# Patient Record
Sex: Male | Born: 1945
Health system: Southern US, Community
[De-identification: ages and names within clinical notes are randomized; demographics above are authoritative.]

## PROBLEM LIST (undated history)

## (undated) DIAGNOSIS — N400 Enlarged prostate without lower urinary tract symptoms: Secondary | ICD-10-CM

## (undated) DIAGNOSIS — T7840XA Allergy, unspecified, initial encounter: Secondary | ICD-10-CM

## (undated) DIAGNOSIS — N529 Male erectile dysfunction, unspecified: Secondary | ICD-10-CM

## (undated) DIAGNOSIS — E785 Hyperlipidemia, unspecified: Secondary | ICD-10-CM

## (undated) DIAGNOSIS — I10 Essential (primary) hypertension: Secondary | ICD-10-CM

## (undated) HISTORY — PX: TONSILLECTOMY: SUR1361

## (undated) HISTORY — DX: Essential (primary) hypertension: I10

## (undated) HISTORY — DX: Benign prostatic hyperplasia without lower urinary tract symptoms: N40.0

## (undated) HISTORY — DX: Male erectile dysfunction, unspecified: N52.9

## (undated) HISTORY — DX: Allergy, unspecified, initial encounter: T78.40XA

## (undated) HISTORY — DX: Hyperlipidemia, unspecified: E78.5

---

## 1998-06-07 ENCOUNTER — Ambulatory Visit (HOSPITAL_COMMUNITY): Admission: RE | Admit: 1998-06-07 | Discharge: 1998-06-07 | Payer: Self-pay | Admitting: Family Medicine

## 2000-01-29 ENCOUNTER — Emergency Department (HOSPITAL_COMMUNITY): Admission: EM | Admit: 2000-01-29 | Discharge: 2000-01-29 | Payer: Self-pay | Admitting: Emergency Medicine

## 2000-01-29 ENCOUNTER — Encounter: Payer: Self-pay | Admitting: Emergency Medicine

## 2004-12-11 ENCOUNTER — Ambulatory Visit: Payer: Self-pay | Admitting: Family Medicine

## 2005-01-08 ENCOUNTER — Ambulatory Visit: Payer: Self-pay | Admitting: Family Medicine

## 2005-04-10 ENCOUNTER — Ambulatory Visit: Payer: Self-pay | Admitting: Family Medicine

## 2005-07-10 ENCOUNTER — Ambulatory Visit: Payer: Self-pay | Admitting: Family Medicine

## 2005-11-04 ENCOUNTER — Ambulatory Visit: Payer: Self-pay | Admitting: Family Medicine

## 2005-11-12 ENCOUNTER — Ambulatory Visit: Payer: Self-pay | Admitting: Family Medicine

## 2005-11-16 ENCOUNTER — Ambulatory Visit: Payer: Self-pay | Admitting: Cardiology

## 2005-11-17 ENCOUNTER — Ambulatory Visit: Payer: Self-pay | Admitting: Family Medicine

## 2007-02-04 ENCOUNTER — Ambulatory Visit: Payer: Self-pay | Admitting: Family Medicine

## 2007-02-04 LAB — CONVERTED CEMR LAB
ALT: 37 units/L (ref 0–40)
AST: 26 units/L (ref 0–37)
Albumin: 3.8 g/dL (ref 3.5–5.2)
Alkaline Phosphatase: 38 units/L — ABNORMAL LOW (ref 39–117)
BUN: 14 mg/dL (ref 6–23)
Basophils Absolute: 0 10*3/uL (ref 0.0–0.1)
Basophils Relative: 0.7 % (ref 0.0–1.0)
Bilirubin, Direct: 0.1 mg/dL (ref 0.0–0.3)
CO2: 31 meq/L (ref 19–32)
Calcium: 9.3 mg/dL (ref 8.4–10.5)
Chloride: 106 meq/L (ref 96–112)
Cholesterol: 165 mg/dL (ref 0–200)
Creatinine, Ser: 1.1 mg/dL (ref 0.4–1.5)
Eosinophils Absolute: 0.2 10*3/uL (ref 0.0–0.6)
Eosinophils Relative: 2.4 % (ref 0.0–5.0)
GFR calc Af Amer: 88 mL/min
GFR calc non Af Amer: 73 mL/min
Glucose, Bld: 97 mg/dL (ref 70–99)
HCT: 40.8 % (ref 39.0–52.0)
HDL: 36.5 mg/dL — ABNORMAL LOW (ref >39.0)
Hemoglobin: 14.2 g/dL (ref 13.0–17.0)
LDL Cholesterol: 112 mg/dL — ABNORMAL HIGH (ref 0–99)
Lymphocytes Relative: 23.1 % (ref 12.0–46.0)
MCHC: 34.9 g/dL (ref 30.0–36.0)
MCV: 92.1 fL (ref 78.0–100.0)
Monocytes Absolute: 0.6 10*3/uL (ref 0.2–0.7)
Monocytes Relative: 9.8 % (ref 3.0–11.0)
Neutro Abs: 4.2 10*3/uL (ref 1.4–7.7)
Neutrophils Relative %: 64 % (ref 43.0–77.0)
PSA: 0.5 ng/mL (ref 0.10–4.00)
Platelets: 210 10*3/uL (ref 150–400)
Potassium: 3.9 meq/L (ref 3.5–5.1)
RBC: 4.42 M/uL (ref 4.22–5.81)
RDW: 13.4 % (ref 11.5–14.6)
Sodium: 142 meq/L (ref 135–145)
TSH: 0.59 microintl units/mL (ref 0.35–5.50)
Total Bilirubin: 0.7 mg/dL (ref 0.3–1.2)
Total CHOL/HDL Ratio: 4.5
Total Protein: 7.1 g/dL (ref 6.0–8.3)
Triglycerides: 83 mg/dL (ref 0–149)
VLDL: 17 mg/dL (ref 0–40)
WBC: 6.5 10*3/uL (ref 4.5–10.5)

## 2007-02-11 ENCOUNTER — Ambulatory Visit: Payer: Self-pay | Admitting: Family Medicine

## 2007-04-05 ENCOUNTER — Ambulatory Visit: Payer: Self-pay | Admitting: Internal Medicine

## 2007-08-06 IMAGING — CT CT ABDOMEN W/O CM
2 of 5 series · 16 of 46 positions shown, 18 images · non-contrast
Comparison: None.

CLINICAL DATA: Dizziness.

HEAD CT WITHOUT CONTRAST
TECHNIQUE: 5mm collimated images were obtained from the base of the skull
through the vertex according to standard protocol without contrast.
CLINICAL DATA: Bilateral flank pain radiating to the bladder for the past 6 to 7
months. No visible hematuria.
ABDOMEN CT WITHOUT CONTRAST - URINARY STONE PROTOCOL
TECHNIQUE: Multidetector CT imaging of the abdomen was performed following the
urinary stone protocol.  No oral or intravenous contrast was administered.
TECHNIQUE: Multidetector CT imaging of the pelvis was performed following the

[Series 5: abd_pel 5.0 b30f st · axial · 0.70mm/px · z∈[+1114,+1504]mm · 13 of 88 slices shown, 15 images]
[im 5/88  soft-tissue]
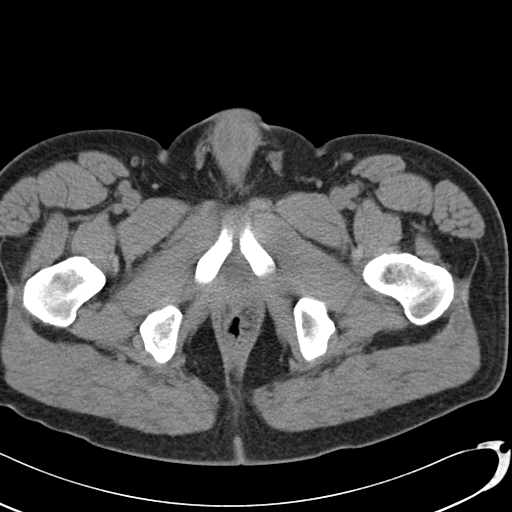
[im 5/88  bone]
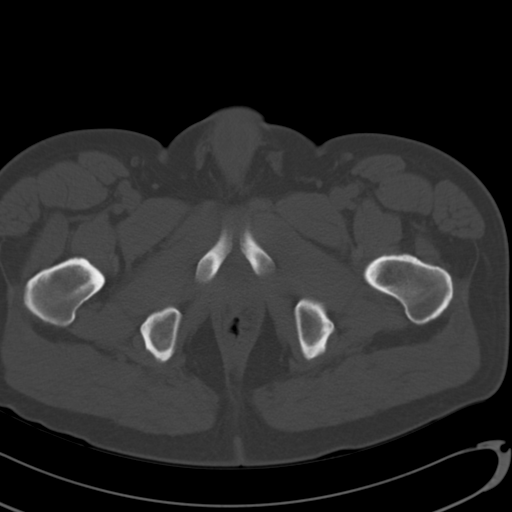
[im 14/88  soft-tissue]
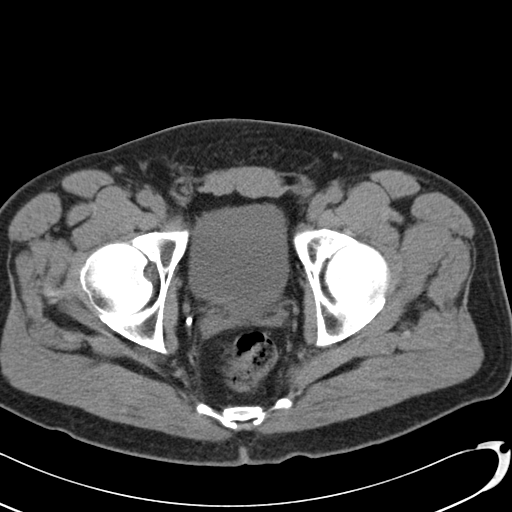
[im 19/88  soft-tissue]
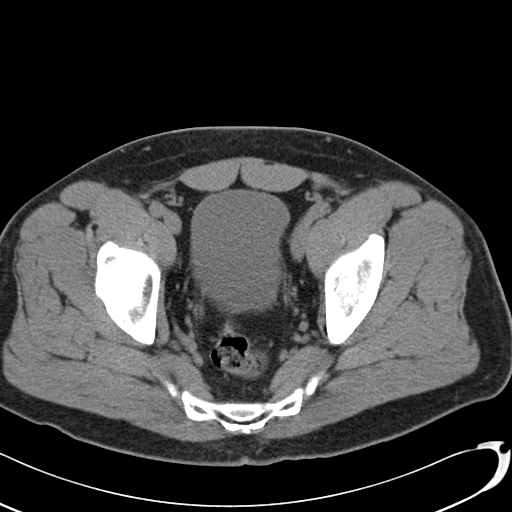
[im 23/88  soft-tissue]
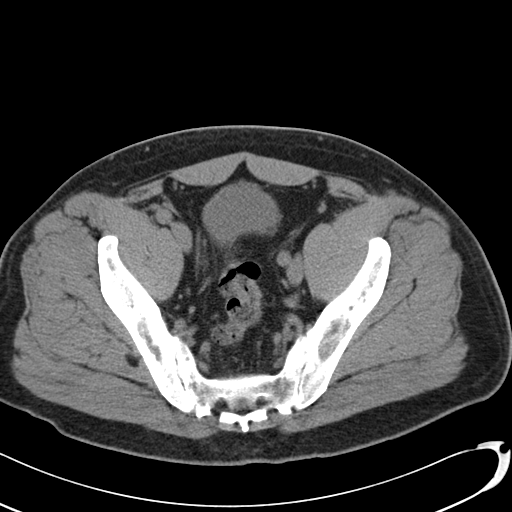
[im 33/88  soft-tissue]
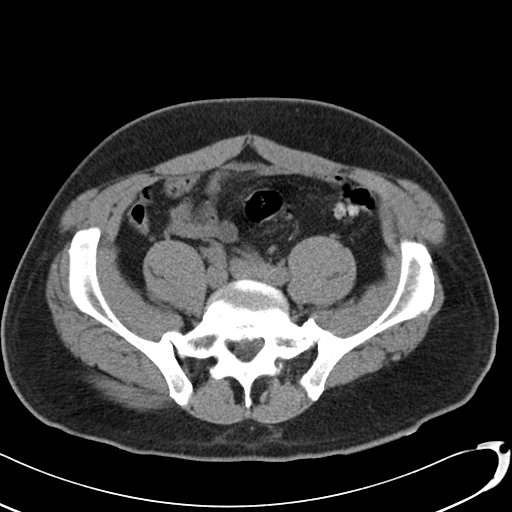
[im 37/88  soft-tissue]
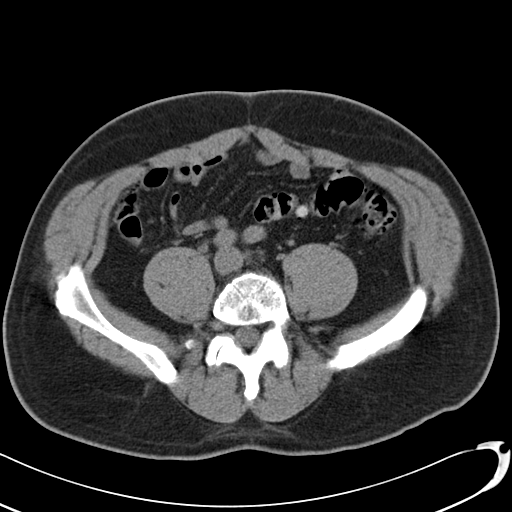
[im 46/88  soft-tissue]
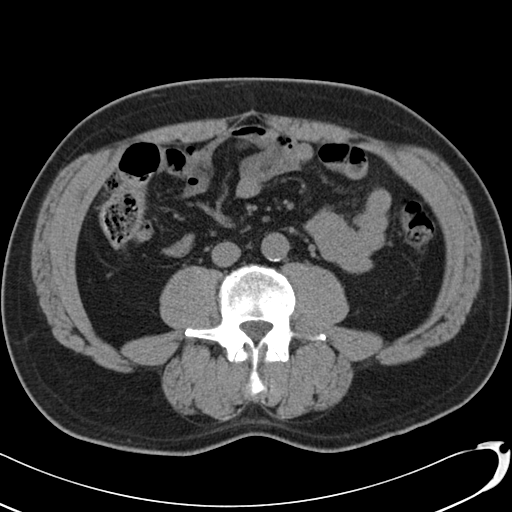
[im 51/88  soft-tissue]
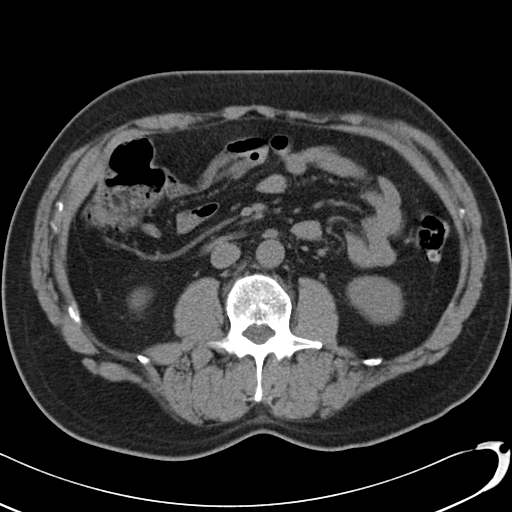
[im 55/88  soft-tissue]
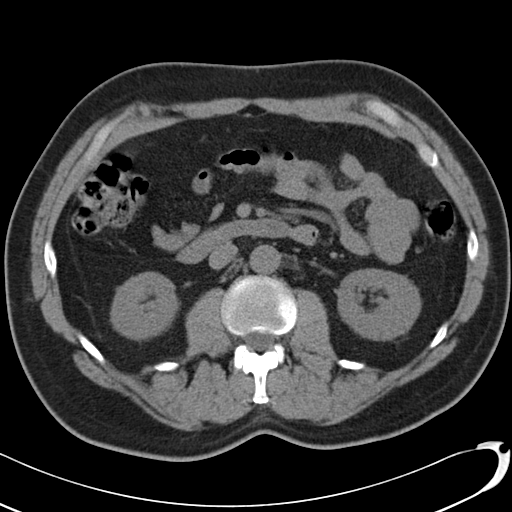
[im 55/88  bone]
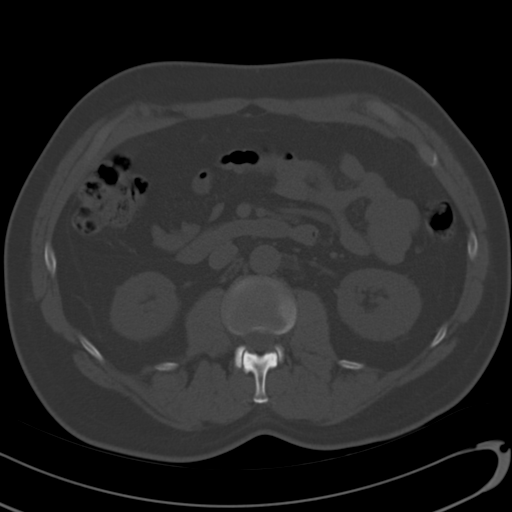
[im 65/88  soft-tissue]
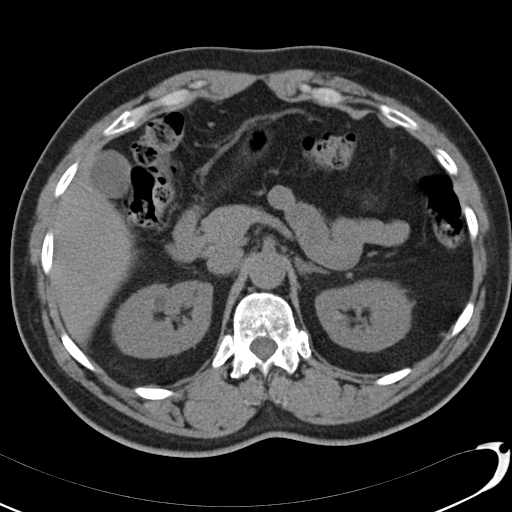
[im 69/88  soft-tissue]
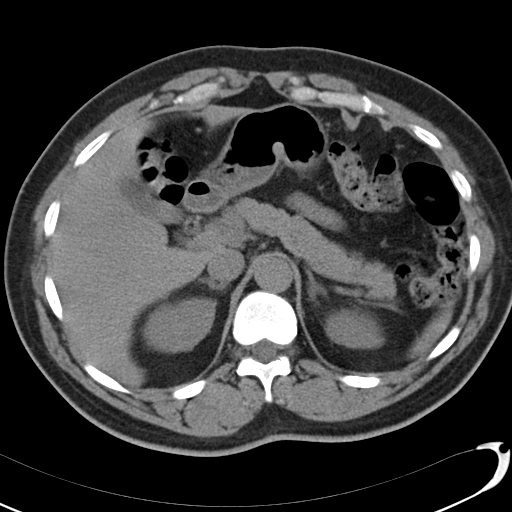
[im 74/88  soft-tissue]
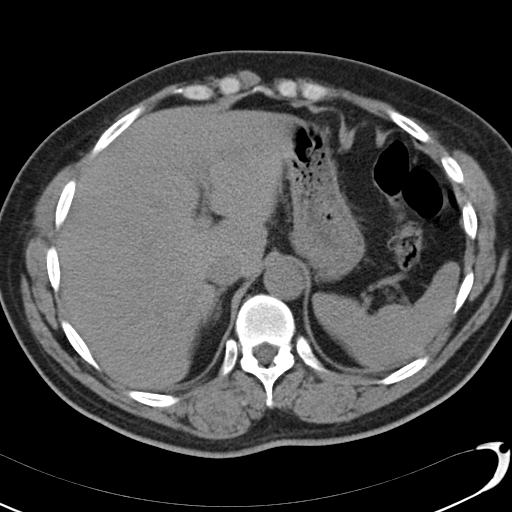
[im 83/88  soft-tissue]
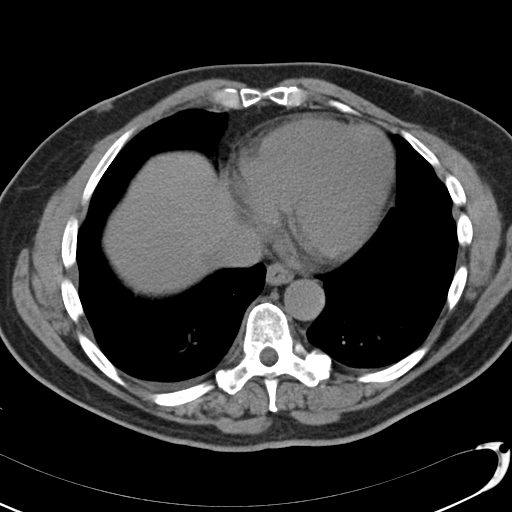

[Series 603: <mpr range> · coronal · 0.89mm/px · 3 of 39 slices shown]
[im 13/39  soft-tissue]
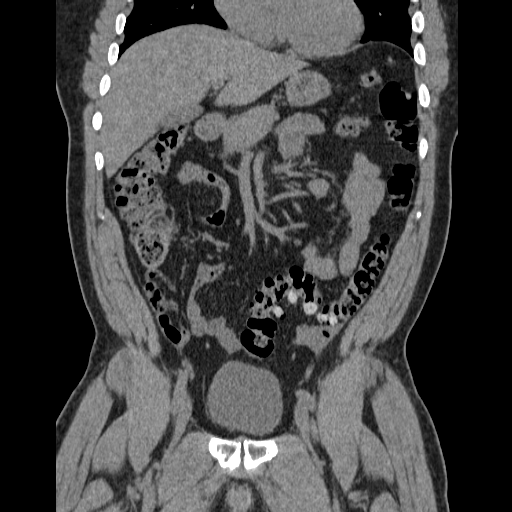
[im 17/39  soft-tissue]
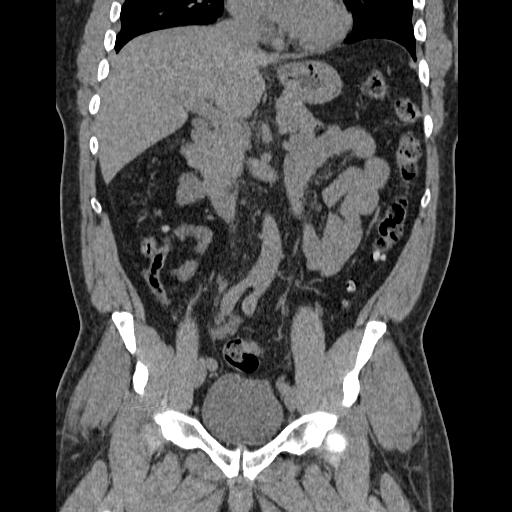
[im 22/39  soft-tissue]
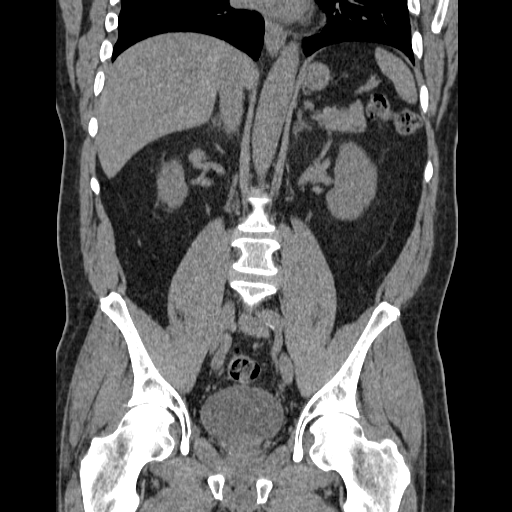

[16 of 46 positions shown; findings below may reference images not displayed]

FINDINGS: Normal appearing cerebral hemispheres and posterior fossa structures.
Normal size and position of the ventricles. No intracranial hemorrhage or
mass-effect. Left maxillary sinus mucosal thickening with a central polyp or
retention cyst. The maximum mucosal thickness in the included portion of the
sinus is 5 mm. Deviation of the mid portion of the nasal septum to the right.
Pneumatization of the anterior aspect of the right middle nasal turbinate.

IMPRESSION

1. No intracranial abnormality.
2. Chronic left maxillary sinusitis with a central polyp or retention cyst.
FINDINGS: Normal appearing kidneys with the exception of a 1.1 cm cyst in the
mid to upper portion of the right kidney. No renal or ureteral calculi and no
hydronephrosis. Minimal bilateral pleural thickening. Mildly prominent
interstitial markings at the lung bases. Normal-appearing liver, spleen,
pancreas, gallbladder and adrenal glands. No gastrointestinal abnormalities or
enlarged lymph nodes. Unremarkable bones.

IMPRESSION

No acute abnormality. Mild chronic interstitial lung disease.

PELVIS CT WITHOUT CONTRAST - URINARY STONE PROTOCOL
FINDINGS: No bladder or ureteral calculi seen. Mildly enlarged prostate gland
with minimal central calcification. Right pelvic phleboliths. Multiple sigmoid
colon diverticula. No masses or enlarged lymph nodes seen. Mild atheromatous
arterial calcifications.

IMPRESSION

1. Sigmoid diverticulosis.
2. No acute abnormality.

## 2008-06-15 ENCOUNTER — Telehealth: Payer: Self-pay | Admitting: Family Medicine

## 2008-06-15 ENCOUNTER — Telehealth: Payer: Self-pay | Admitting: *Deleted

## 2008-07-20 ENCOUNTER — Ambulatory Visit: Payer: Self-pay | Admitting: Family Medicine

## 2008-07-20 LAB — CONVERTED CEMR LAB
ALT: 48 units/L (ref 0–53)
AST: 30 units/L (ref 0–37)
Albumin: 3.8 g/dL (ref 3.5–5.2)
Alkaline Phosphatase: 41 units/L (ref 39–117)
BUN: 19 mg/dL (ref 6–23)
Basophils Absolute: 0.1 10*3/uL (ref 0.0–0.1)
Basophils Relative: 0.9 % (ref 0.0–3.0)
Bilirubin Urine: NEGATIVE
Bilirubin, Direct: 0.1 mg/dL (ref 0.0–0.3)
Blood in Urine, dipstick: NEGATIVE
CO2: 31 meq/L (ref 19–32)
Calcium: 9.3 mg/dL (ref 8.4–10.5)
Chloride: 104 meq/L (ref 96–112)
Cholesterol: 195 mg/dL (ref 0–200)
Creatinine, Ser: 1.3 mg/dL (ref 0.4–1.5)
Eosinophils Absolute: 0.1 10*3/uL (ref 0.0–0.7)
Eosinophils Relative: 1.3 % (ref 0.0–5.0)
GFR calc Af Amer: 72 mL/min
GFR calc non Af Amer: 60 mL/min
Glucose, Bld: 93 mg/dL (ref 70–99)
Glucose, Urine, Semiquant: NEGATIVE
HCT: 41.6 % (ref 39.0–52.0)
HDL: 29.3 mg/dL — ABNORMAL LOW (ref >39.0)
Hemoglobin: 14.6 g/dL (ref 13.0–17.0)
Ketones, urine, test strip: NEGATIVE
LDL Cholesterol: 147 mg/dL — ABNORMAL HIGH (ref 0–99)
Lymphocytes Relative: 23.9 % (ref 12.0–46.0)
MCHC: 35.1 g/dL (ref 30.0–36.0)
MCV: 90.8 fL (ref 78.0–100.0)
Monocytes Absolute: 0.6 10*3/uL (ref 0.1–1.0)
Monocytes Relative: 9.8 % (ref 3.0–12.0)
Neutro Abs: 4 10*3/uL (ref 1.4–7.7)
Neutrophils Relative %: 64.1 % (ref 43.0–77.0)
Nitrite: NEGATIVE
PSA: 0.6 ng/mL (ref 0.10–4.00)
Platelets: 210 10*3/uL (ref 150–400)
Potassium: 4.3 meq/L (ref 3.5–5.1)
Protein, U semiquant: NEGATIVE
RBC: 4.58 M/uL (ref 4.22–5.81)
RDW: 12.9 % (ref 11.5–14.6)
Sodium: 141 meq/L (ref 135–145)
Specific Gravity, Urine: 1.02
TSH: 0.73 microintl units/mL (ref 0.35–5.50)
Total Bilirubin: 0.7 mg/dL (ref 0.3–1.2)
Total CHOL/HDL Ratio: 6.7
Total Protein: 7.2 g/dL (ref 6.0–8.3)
Triglycerides: 94 mg/dL (ref 0–149)
Urobilinogen, UA: 0.2
VLDL: 19 mg/dL (ref 0–40)
WBC Urine, dipstick: NEGATIVE
WBC: 6.3 10*3/uL (ref 4.5–10.5)
pH: 7

## 2008-07-27 ENCOUNTER — Ambulatory Visit: Payer: Self-pay | Admitting: Family Medicine

## 2008-07-27 DIAGNOSIS — E785 Hyperlipidemia, unspecified: Secondary | ICD-10-CM

## 2008-07-27 DIAGNOSIS — R351 Nocturia: Secondary | ICD-10-CM

## 2008-07-27 DIAGNOSIS — I10 Essential (primary) hypertension: Secondary | ICD-10-CM

## 2008-07-27 DIAGNOSIS — N401 Enlarged prostate with lower urinary tract symptoms: Secondary | ICD-10-CM

## 2008-07-27 DIAGNOSIS — F528 Other sexual dysfunction not due to a substance or known physiological condition: Secondary | ICD-10-CM

## 2009-10-30 ENCOUNTER — Telehealth: Payer: Self-pay | Admitting: Family Medicine

## 2009-11-26 ENCOUNTER — Ambulatory Visit: Payer: Self-pay | Admitting: Family Medicine

## 2009-11-26 LAB — CONVERTED CEMR LAB
ALT: 30 units/L (ref 0–53)
AST: 29 units/L (ref 0–37)
Alkaline Phosphatase: 41 units/L (ref 39–117)
BUN: 20 mg/dL (ref 6–23)
Bilirubin Urine: NEGATIVE
Bilirubin, Direct: 0.1 mg/dL (ref 0.0–0.3)
Blood in Urine, dipstick: NEGATIVE
Cholesterol: 154 mg/dL (ref 0–200)
Creatinine, Ser: 1.3 mg/dL (ref 0.4–1.5)
Eosinophils Relative: 1.6 % (ref 0.0–5.0)
GFR calc non Af Amer: 71.94 mL/min (ref >60)
Glucose, Urine, Semiquant: NEGATIVE
HCT: 44.6 % (ref 39.0–52.0)
Ketones, urine, test strip: NEGATIVE
LDL Cholesterol: 102 mg/dL — ABNORMAL HIGH (ref 0–99)
Monocytes Relative: 8.7 % (ref 3.0–12.0)
Neutrophils Relative %: 68.5 % (ref 43.0–77.0)
Nitrite: NEGATIVE
Platelets: 201 10*3/uL (ref 150.0–400.0)
Potassium: 4.8 meq/L (ref 3.5–5.1)
Protein, U semiquant: NEGATIVE
Specific Gravity, Urine: 1.02
Total Bilirubin: 0.6 mg/dL (ref 0.3–1.2)
Urobilinogen, UA: 0.2
VLDL: 12.6 mg/dL (ref 0.0–40.0)
WBC Urine, dipstick: NEGATIVE
WBC: 6.5 10*3/uL (ref 4.5–10.5)
pH: 5

## 2009-12-02 ENCOUNTER — Ambulatory Visit: Payer: Self-pay | Admitting: Family Medicine

## 2010-11-24 ENCOUNTER — Telehealth: Payer: Self-pay | Admitting: Family Medicine

## 2010-12-04 NOTE — Progress Notes (Signed)
Summary: refill request  Phone Note Refill Request Message from:  Fax from Pharmacy on November 24, 2010 5:24 PM  Refills Requested: Medication #1:  DOXAZOSIN MESYLATE 8 MG  TABS 1 tab qam  Medication #2:  LISINOPRIL-HYDROCHLOROTHIAZIDE 20-25 MG  TABS 1 tab qam;CPX WHEN DUE  Medication #3:  ZOCOR 20 MG TABS 1 tab @ bedtime Initial call taken by: Kern Reap CMA Duncan Dull),  November 24, 2010 5:24 PM    Prescriptions: ZOCOR 20 MG TABS (SIMVASTATIN) 1 tab @ bedtime  #90 Tablet x 1   Entered by:   Kern Reap CMA (AAMA)   Authorized by:   Roderick Pee MD   Signed by:   Kern Reap CMA (AAMA) on 11/24/2010   Method used:   Faxed to ...       [facility] (retail)       [phone]       [place], [place]  [address]       Ph: [phone]       Fax: [phone]   RxID:   [id number] LISINOPRIL-HYDROCHLOROTHIAZIDE 20-25 MG  TABS (LISINOPRIL-HYDROCHLOROTHIAZIDE) 1 tab qam;CPX WHEN DUE, OR OV TO RF MEDS  #90 Tablet x 1   Entered by:   Kern Reap CMA (AAMA)   Authorized by:   Roderick Pee MD   Signed by:   Kern Reap CMA (AAMA) on 11/24/2010   Method used:   Faxed to ...       [facility] (retail)       [phone] [address]       [place], [place]  [address]       Ph: [phone]       Fax: [phone]   RxID:   [id number] DOXAZOSIN MESYLATE 8 MG  TABS (DOXAZOSIN MESYLATE) 1 tab qam  #90 Tablet x 1   Entered by:   Kern Reap CMA (AAMA)   Authorized by:   Roderick Pee MD   Signed by:   Kern Reap CMA (AAMA) on 11/24/2010   Method used:   Faxed to ...       [facility] (retail)       [phone]       [place], [place]  [address]       Ph: [phone]       Fax: [phone]   RxID:   [id number]

## 2010-12-04 NOTE — Assessment & Plan Note (Signed)
Summary: CPX // RS   Vital Signs:  Patient profile:   65 year old male Height:      71 inches Weight:      215 pounds BMI:     30.09 Temp:     97.3 degrees F oral BP sitting:   110 / 70  (left arm) Cuff size:   regular  Vitals Entered By: Kern Reap CMA Duncan Dull) (December 02, 2009 3:12 PM)  Reason for Visit cpx  History of Present Illness: Jared Bryant is a 65 year old male, nonsmoker, married, who comes in today for evaluation of hypertension, hyperlipidemia, and erectile dysfunction.  His hypertension is treated with Cardura 8 mg nightly and lisinopril 20 -- 25 daily.  BP 110/70.  The hyperlipidemia is treated with Zocor 20 mg nightly lipids are at goal LDL 102.  He uses Cialis 20 mg daily for ED.  He gets routine eye care not dental care... colonoscopy 2008 in GI normal, tetanus, 2000, seasonal flu shot today  Allergies: No Known Drug Allergies  Past History:  Past medical, surgical, family and social histories (including risk factors) reviewed, and no changes noted (except as noted below).  Past Medical History: Reviewed history from 07/27/2008 and no changes required. Hyperlipidemia Benign prostatic hypertrophy ED Hypertension  Family History: Reviewed history from 07/27/2008 and no changes required. Family History High cholesterol Family History Hypertension  Social History: Reviewed history from 07/27/2008 and no changes required. 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 No apparent alopecia or balding. Eyes:  No corneal or conjunctival inflammation noted. EOMI. Perrla. Funduscopic exam benign, without hemorrhages, exudates or  papilledema. Vision grossly normal. Ears:  External ear exam shows no significant lesions or deformities.  Otoscopic examination reveals clear canals, tympanic membranes are intact bilaterally without bulging, retraction, inflammation or discharge. Hearing is grossly normal bilaterally. Nose:  External nasal examination shows no deformity or inflammation. Nasal mucosa are pink and moist without lesions or exudates. Mouth:  Oral mucosa and oropharynx without lesions or exudates.  Teeth in good repair. Neck:  No deformities, masses, or tenderness noted. Chest Wall:  No deformities, masses, tenderness or gynecomastia noted. Breasts:  No masses or gynecomastia noted Lungs:  Normal respiratory effort, chest expands symmetrically. Lungs are clear to auscultation, no crackles or wheezes. Heart:  Normal rate and regular rhythm. S1 and S2 normal without gallop, murmur, click, rub or other extra sounds. Abdomen:  Bowel sounds positive,abdomen soft and non-tender without masses, organomegaly or hernias noted. Rectal:  No external abnormalities noted. Normal sphincter tone. No rectal masses or tenderness. Genitalia:  Testes bilaterally descended without nodularity, tenderness or masses. No scrotal masses or lesions. No penis lesions or urethral discharge. Prostate:  Prostate gland firm and smooth, no enlargement, nodularity, tenderness, mass, asymmetry or induration. Msk:  No deformity or scoliosis noted of thoracic or lumbar spine.   Pulses:  R and L carotid,radial,femoral,dorsalis pedis and posterior tibial pulses are full and equal bilaterally Extremities:  No clubbing, cyanosis, edema, or deformity noted with normal full range of motion of all joints.   Neurologic:  No cranial nerve deficits noted. Station and gait are normal. Plantar reflexes are down-going bilaterally. DTRs are symmetrical throughout. Sensory, motor and coordinative functions appear intact. Skin:  Intact without suspicious lesions or  rashes Cervical Nodes:  No lymphadenopathy noted Axillary Nodes:  No palpable lymphadenopathy Inguinal Nodes:  No significant adenopathy Psych:  Cognition and judgment appear intact. Alert and cooperative with normal attention span and concentration. No apparent delusions, illusions, hallucinations   Impression & Recommendations:  Problem # 1:  ERECTILE DYSFUNCTION (ICD-302.72) Assessment Unchanged  His updated medication list for this problem includes:    Cialis 20 Mg Tabs (Tadalafil) ..... Uad  Orders: Prescription Created Electronically [phone])  Problem # 2:  HYPERTENSION (ICD-401.9) Assessment: Improved  His updated medication list for this problem includes:    Doxazosin Mesylate 8 Mg Tabs (Doxazosin mesylate) .[name]... 1 tab qam    Lisinopril-hydrochlorothiazide 20-25 Mg Tabs (Lisinopril-hydrochlorothiazide) .[name]... 1 tab qam;cpx when due, or ov to rf meds  Orders: Prescription Created Electronically [phone])  Problem # 3:  BENIGN PROSTATIC HYPERTROPHY (ICD-600.00) Assessment: Unchanged  His updated medication list for this problem includes:    Doxazosin Mesylate 8 Mg Tabs (Doxazosin mesylate) .[name]... 1 tab qam  Orders: Prescription Created Electronically [phone])  Problem # 4:  HYPERLIPIDEMIA (ICD-272.4) Assessment: Improved  His updated medication list for this problem includes:    Zocor 20 Mg Tabs (Simvastatin) .[name]... 1 tab @ bedtime  Orders: EKG w/ Interpretation (93000) Prescription Created Electronically [phone])  Complete Medication List: 1)  Doxazosin Mesylate 8 Mg Tabs (Doxazosin mesylate) .[name].. 1 tab qam 2)  Lisinopril-hydrochlorothiazide 20-25 Mg Tabs (Lisinopril-hydrochlorothiazide) .[name].. 1 tab qam;cpx when due, or ov to rf meds 3)  Zocor 20 Mg Tabs (Simvastatin) .[name].. 1 tab @ bedtime 4)  Cialis 20 Mg Tabs (Tadalafil) .... Uad  Patient Instructions: 1)  continue your current medications.  It's okay to take every thing in the morning. 2)  Begin Motrin 600 mg  twice a day with food for the soreness in her left knee.  If the soreness persists.  Orthopedic consult with Dr. Norlene Campbell 3)  Please schedule a follow-up appointment in 1 year. 4)  Take an Aspirin every day. Prescriptions: CIALIS 20 MG TABS (TADALAFIL) UAD  #6 x 11   Entered and Authorized by:   Roderick Pee MD   Signed by:   Roderick Pee MD on 12/02/2009   Method used:   Print then Give to Patient   RxID:   [id number] ZOCOR 20 MG TABS (SIMVASTATIN) 1 tab @ bedtime  #100 x 3   Entered and Authorized by:   Roderick Pee MD   Signed by:   Roderick Pee MD on 12/02/2009   Method used:   Print then Give to Patient   RxID:   [id number] LISINOPRIL-HYDROCHLOROTHIAZIDE 20-25 MG  TABS (LISINOPRIL-HYDROCHLOROTHIAZIDE) 1 tab qam;CPX WHEN DUE, OR OV TO RF MEDS  #100 x 3   Entered and Authorized by:   Roderick Pee MD   Signed by:   Roderick Pee MD on 12/02/2009   Method used:   Print then Give to Patient   RxID:   [id number] DOXAZOSIN MESYLATE 8 MG  TABS (DOXAZOSIN MESYLATE) 1 tab qam  #100 x 3   Entered and Authorized by:   Roderick Pee MD   Signed by:   Roderick Pee MD on 12/02/2009   Method used:   Print then Give to Patient   RxID:   [id number]    Immunization History:  Tetanus/Td Immunization History:    Tetanus/Td:  historical (11/02/2006)

## 2010-12-22 ENCOUNTER — Encounter: Payer: Self-pay | Admitting: Family Medicine

## 2010-12-23 ENCOUNTER — Ambulatory Visit (INDEPENDENT_AMBULATORY_CARE_PROVIDER_SITE_OTHER): Payer: PRIVATE HEALTH INSURANCE | Admitting: Family Medicine

## 2010-12-23 ENCOUNTER — Encounter: Payer: Self-pay | Admitting: Family Medicine

## 2010-12-23 VITALS — BP 102/72 | Temp 97.8°F | Ht 71.0 in | Wt 218.0 lb

## 2010-12-23 DIAGNOSIS — M25512 Pain in left shoulder: Secondary | ICD-10-CM

## 2010-12-23 DIAGNOSIS — I959 Hypotension, unspecified: Secondary | ICD-10-CM

## 2010-12-23 DIAGNOSIS — M25519 Pain in unspecified shoulder: Secondary | ICD-10-CM

## 2010-12-23 NOTE — Patient Instructions (Signed)
Hold the lisinopril until Saturday been restarted by taking a half a tablet a day instead of a full tablet and monitor your blood pressure daily in the morning.  The goal for your blood pressure is 135/85.  Take Motrin 400 mg twice daily with food, and we will get you set up for a consult and physical therapy to relieve the pain in her shoulder

## 2010-12-23 NOTE — Progress Notes (Signed)
  Subjective:    Patient ID: Jared Bryant, male    DOB: 1946-02-28, 65 y.o.   MRN: [id number]  HPI  Jared Bryant is a 65 year old male, who comes in today for evaluation of two problems.  He states for the past 6 weeks.  He been lightheaded when he stands up.  No syncope.  No true vertigo.  BP 102/72.  Right arm sitting position.  He is also complaining of pain in his left shoulder.  No history of trauma.  Pain is been present for about 6 weeks.  Review of Systems    Negative Objective:   Physical Exam    Well-developed well-nourished, male in no acute distress.  BP right arm sitting position, one or 2/70.  He has decreased range of motion of left shoulder, consistent with some scarring in the bursa.  No evidence of rotator cuff damage    Assessment & Plan:  Low blood pressure,,,,,,,,,, hold lisinopril for 4 days, then restart by taking a half a tablet a day.  Monitor blood pressure goal 135/85.  Left shoulder pain,,,,,,,,, recommend Motrin 400 mg twice daily, and PT consult

## 2011-06-22 ENCOUNTER — Other Ambulatory Visit: Payer: Self-pay | Admitting: Family Medicine

## 2011-12-11 ENCOUNTER — Other Ambulatory Visit: Payer: Self-pay | Admitting: Family Medicine

## 2012-01-04 ENCOUNTER — Other Ambulatory Visit: Payer: Self-pay | Admitting: Family Medicine

## 2012-07-19 ENCOUNTER — Other Ambulatory Visit: Payer: Self-pay | Admitting: Family Medicine

## 2012-07-22 ENCOUNTER — Other Ambulatory Visit: Payer: Self-pay | Admitting: Family Medicine

## 2012-07-22 MED ORDER — SIMVASTATIN 20 MG PO TABS: 20.0000 mg | ORAL_TABLET | Freq: Every day | ORAL | Status: DC

## 2012-07-22 MED ORDER — DOXAZOSIN MESYLATE 8 MG PO TABS: 8.0000 mg | ORAL_TABLET | Freq: Every day | ORAL | Status: DC

## 2012-07-22 NOTE — Telephone Encounter (Signed)
Pt needs refills on simvastatin and doxazosin call into [facility]. Pt has cpx sch for 10-2012

## 2012-10-06 ENCOUNTER — Other Ambulatory Visit (INDEPENDENT_AMBULATORY_CARE_PROVIDER_SITE_OTHER): Payer: Self-pay

## 2012-10-06 DIAGNOSIS — Z0000 Encounter for general adult medical examination without abnormal findings: Secondary | ICD-10-CM

## 2012-10-06 LAB — LIPID PANEL
Cholesterol: 152 mg/dL (ref 0–200)
HDL: 34.8 mg/dL — ABNORMAL LOW (ref >39.00)
Triglycerides: 58 mg/dL (ref 0.0–149.0)
VLDL: 11.6 mg/dL (ref 0.0–40.0)

## 2012-10-06 LAB — POCT URINALYSIS DIPSTICK
Bilirubin, UA: NEGATIVE
Blood, UA: NEGATIVE
Glucose, UA: NEGATIVE
Ketones, UA: NEGATIVE
Nitrite, UA: NEGATIVE
pH, UA: 5.5

## 2012-10-06 LAB — CBC WITH DIFFERENTIAL/PLATELET
Basophils Absolute: 0 10*3/uL (ref 0.0–0.1)
Eosinophils Absolute: 0.1 10*3/uL (ref 0.0–0.7)
Eosinophils Relative: 0.8 % (ref 0.0–5.0)
HCT: 44.2 % (ref 39.0–52.0)
Lymphs Abs: 1.3 10*3/uL (ref 0.7–4.0)
MCV: 90.5 fl (ref 78.0–100.0)
Monocytes Absolute: 0.6 10*3/uL (ref 0.1–1.0)
Neutrophils Relative %: 69.9 % (ref 43.0–77.0)
Platelets: 224 10*3/uL (ref 150.0–400.0)
RDW: 13.9 % (ref 11.5–14.6)
WBC: 6.6 10*3/uL (ref 4.5–10.5)

## 2012-10-06 LAB — HEPATIC FUNCTION PANEL
AST: 28 U/L (ref 0–37)
Albumin: 4.1 g/dL (ref 3.5–5.2)
Alkaline Phosphatase: 48 U/L (ref 39–117)
Bilirubin, Direct: 0.2 mg/dL (ref 0.0–0.3)
Total Bilirubin: 0.7 mg/dL (ref 0.3–1.2)

## 2012-10-06 LAB — BASIC METABOLIC PANEL
Calcium: 9.6 mg/dL (ref 8.4–10.5)
GFR: 71.92 mL/min (ref >60.00)
Potassium: 4.1 mEq/L (ref 3.5–5.1)
Sodium: 137 mEq/L (ref 135–145)

## 2012-10-06 LAB — PSA: PSA: 0.82 ng/mL (ref 0.10–4.00)

## 2012-10-13 ENCOUNTER — Encounter: Payer: Self-pay | Admitting: Family Medicine

## 2012-10-13 ENCOUNTER — Ambulatory Visit (INDEPENDENT_AMBULATORY_CARE_PROVIDER_SITE_OTHER): Payer: BC Managed Care – PPO | Admitting: Family Medicine

## 2012-10-13 VITALS — BP 130/90 | Temp 97.8°F | Ht 69.5 in | Wt 216.0 lb

## 2012-10-13 DIAGNOSIS — F528 Other sexual dysfunction not due to a substance or known physiological condition: Secondary | ICD-10-CM

## 2012-10-13 DIAGNOSIS — Z23 Encounter for immunization: Secondary | ICD-10-CM

## 2012-10-13 DIAGNOSIS — I10 Essential (primary) hypertension: Secondary | ICD-10-CM

## 2012-10-13 DIAGNOSIS — N400 Enlarged prostate without lower urinary tract symptoms: Secondary | ICD-10-CM

## 2012-10-13 DIAGNOSIS — E785 Hyperlipidemia, unspecified: Secondary | ICD-10-CM

## 2012-10-13 MED ORDER — SIMVASTATIN 20 MG PO TABS: 20.0000 mg | ORAL_TABLET | Freq: Every day | ORAL | Status: DC

## 2012-10-13 MED ORDER — LISINOPRIL-HYDROCHLOROTHIAZIDE 20-25 MG PO TABS: ORAL_TABLET | ORAL | Status: DC

## 2012-10-13 MED ORDER — VARDENAFIL HCL 20 MG PO TABS: 20.0000 mg | ORAL_TABLET | Freq: Every day | ORAL | Status: DC | PRN

## 2012-10-13 MED ORDER — DOXAZOSIN MESYLATE 8 MG PO TABS: ORAL_TABLET | ORAL | Status: DC

## 2012-10-13 NOTE — Progress Notes (Signed)
  Subjective:    Patient ID: Jared Bryant, male    DOB: 03-09-1946, 66 y.o.   MRN: [id number]  HPI  Jared Bryant is a 66 year old married male nonsmoker who comes in today for general physical examination because of a history of hypertension, BPH, hyperlipidemia, erectile dysfunction  He states she's doing well overall except the Cialis doesn't seem to help he has difficulty ejaculating.  He also twisted his left knee and at a football game about 4 weeks ago. He is a high [employer]. Been a couple days later he twisted his back changing a tire. His back is still sore and he seems to be improving with elevation ice and some OTC Motrin.  He gets routine eye care, dental care, colonoscopy and GI, tetanus 2008, seasonal flu shot today information given on shingles.  Review of Systems  Constitutional: Negative.   HENT: Negative.   Eyes: Negative.   Respiratory: Negative.   Cardiovascular: Negative.   Gastrointestinal: Negative.   Genitourinary: Negative.   Musculoskeletal: Negative.   Skin: Negative.   Neurological: Negative.   Hematological: Negative.   Psychiatric/Behavioral: Negative.        Objective:   Physical Exam  Constitutional: He is oriented to person, place, and time. He appears well-developed and well-nourished.  HENT:  Head: Normocephalic and atraumatic.  Right Ear: External ear normal.  Left Ear: External ear normal.  Nose: Nose normal.  Mouth/Throat: Oropharynx is clear and moist.  Eyes: Conjunctivae normal and EOM are normal. Pupils are equal, round, and reactive to light.  Neck: Normal range of motion. Neck supple. No JVD present. No tracheal deviation present. No thyromegaly present.  Cardiovascular: Normal rate, regular rhythm, normal heart sounds and intact distal pulses.  Exam reveals no gallop and no friction rub.   No murmur heard. Pulmonary/Chest: Effort normal and breath sounds normal. No stridor. No respiratory distress. He has no wheezes. He  has no rales. He exhibits no tenderness.  Abdominal: Soft. Bowel sounds are normal. He exhibits no distension and no mass. There is no tenderness. There is no rebound and no guarding.  Genitourinary: Rectum normal, prostate normal and penis normal. Guaiac negative stool. No penile tenderness.  Musculoskeletal: Normal range of motion. He exhibits no edema and no tenderness.  Lymphadenopathy:    He has no cervical adenopathy.  Neurological: He is alert and oriented to person, place, and time. He has normal reflexes. No cranial nerve deficit. He exhibits normal muscle tone.  Skin: Skin is warm and dry. No rash noted. No erythema. No pallor.  Psychiatric: He has a normal mood and affect. His behavior is normal. Judgment and thought content normal.          Assessment & Plan:  Healthy male  Symptoms of BPH with outlet obstruction continue Cardura 8 mg daily  Hypertension continue Zestoretic 20-25 daily  Hyperlipidemia continue Zocor 20 mg daily and an aspirin tablet  Erectile dysfunction switch to Levitra

## 2012-10-13 NOTE — Patient Instructions (Signed)
Continue the Cardura Zestoretic and Zocor  Stop the is Cialis  Try the Levitra 20 mg,,,,,,,,,, one half tab 2 hours prior to sex  Motrin 600 mg twice daily and elevation and ice for your knee pain  Return.

## 2013-10-31 ENCOUNTER — Other Ambulatory Visit: Payer: Self-pay | Admitting: Family Medicine

## 2013-11-21 ENCOUNTER — Other Ambulatory Visit: Payer: Self-pay | Admitting: Family Medicine

## 2013-11-21 DIAGNOSIS — E785 Hyperlipidemia, unspecified: Secondary | ICD-10-CM

## 2013-11-22 MED ORDER — SIMVASTATIN 20 MG PO TABS: 20.0000 mg | ORAL_TABLET | Freq: Every day | ORAL | Status: DC

## 2013-11-22 NOTE — Addendum Note (Signed)
Addended by: Kern ReapVEREEN, Airabella Barley B on: 11/22/2013 12:23 PM   Modules accepted: Orders

## 2013-11-22 NOTE — Telephone Encounter (Signed)
Pt has appointment for cpx scheduled on 12/12/13.  He is requesting refill of simvastatin (ZOCOR) 20 MG tablet until his appt.

## 2013-12-12 ENCOUNTER — Ambulatory Visit (INDEPENDENT_AMBULATORY_CARE_PROVIDER_SITE_OTHER): Payer: Medicare Other | Admitting: Family Medicine

## 2013-12-12 ENCOUNTER — Encounter: Payer: Self-pay | Admitting: Family Medicine

## 2013-12-12 ENCOUNTER — Telehealth: Payer: Self-pay | Admitting: Gastroenterology

## 2013-12-12 VITALS — BP 140/90 | Temp 98.0°F | Ht 70.0 in | Wt 231.0 lb

## 2013-12-12 DIAGNOSIS — Z23 Encounter for immunization: Secondary | ICD-10-CM

## 2013-12-12 DIAGNOSIS — R141 Gas pain: Secondary | ICD-10-CM | POA: Diagnosis not present

## 2013-12-12 DIAGNOSIS — N400 Enlarged prostate without lower urinary tract symptoms: Secondary | ICD-10-CM

## 2013-12-12 DIAGNOSIS — F528 Other sexual dysfunction not due to a substance or known physiological condition: Secondary | ICD-10-CM

## 2013-12-12 DIAGNOSIS — I10 Essential (primary) hypertension: Secondary | ICD-10-CM | POA: Diagnosis not present

## 2013-12-12 DIAGNOSIS — E785 Hyperlipidemia, unspecified: Secondary | ICD-10-CM

## 2013-12-12 DIAGNOSIS — Z0000 Encounter for general adult medical examination without abnormal findings: Secondary | ICD-10-CM

## 2013-12-12 DIAGNOSIS — R143 Flatulence: Secondary | ICD-10-CM

## 2013-12-12 DIAGNOSIS — R140 Abdominal distension (gaseous): Secondary | ICD-10-CM

## 2013-12-12 DIAGNOSIS — R142 Eructation: Secondary | ICD-10-CM

## 2013-12-12 LAB — CBC WITH DIFFERENTIAL/PLATELET
BASOS ABS: 0 10*3/uL (ref 0.0–0.1)
Basophils Relative: 0.2 % (ref 0.0–3.0)
Eosinophils Absolute: 0.1 10*3/uL (ref 0.0–0.7)
Eosinophils Relative: 0.9 % (ref 0.0–5.0)
HEMATOCRIT: 45.8 % (ref 39.0–52.0)
Hemoglobin: 14.9 g/dL (ref 13.0–17.0)
LYMPHS ABS: 1.4 10*3/uL (ref 0.7–4.0)
LYMPHS PCT: 19.8 % (ref 12.0–46.0)
MCHC: 32.5 g/dL (ref 30.0–36.0)
MCV: 92.3 fl (ref 78.0–100.0)
MONOS PCT: 8 % (ref 3.0–12.0)
Monocytes Absolute: 0.6 10*3/uL (ref 0.1–1.0)
NEUTROS PCT: 71.1 % (ref 43.0–77.0)
Neutro Abs: 5 10*3/uL (ref 1.4–7.7)
Platelets: 212 10*3/uL (ref 150.0–400.0)
RBC: 4.96 Mil/uL (ref 4.22–5.81)
RDW: 14.2 % (ref 11.5–14.6)
WBC: 7.1 10*3/uL (ref 4.5–10.5)

## 2013-12-12 LAB — LIPID PANEL
CHOLESTEROL: 140 mg/dL (ref 0–200)
HDL: 33.1 mg/dL — ABNORMAL LOW (ref >39.00)
LDL Cholesterol: 91 mg/dL (ref 0–99)
TRIGLYCERIDES: 79 mg/dL (ref 0.0–149.0)
Total CHOL/HDL Ratio: 4
VLDL: 15.8 mg/dL (ref 0.0–40.0)

## 2013-12-12 LAB — HEPATIC FUNCTION PANEL
ALT: 46 U/L (ref 0–53)
AST: 35 U/L (ref 0–37)
Albumin: 4 g/dL (ref 3.5–5.2)
Alkaline Phosphatase: 37 U/L — ABNORMAL LOW (ref 39–117)
Bilirubin, Direct: 0.1 mg/dL (ref 0.0–0.3)
TOTAL PROTEIN: 7 g/dL (ref 6.0–8.3)
Total Bilirubin: 0.7 mg/dL (ref 0.3–1.2)

## 2013-12-12 LAB — POCT URINALYSIS DIPSTICK
BILIRUBIN UA: NEGATIVE
Blood, UA: NEGATIVE
Glucose, UA: NEGATIVE
Ketones, UA: NEGATIVE
LEUKOCYTES UA: NEGATIVE
Nitrite, UA: NEGATIVE
PH UA: 5
Protein, UA: NEGATIVE
Spec Grav, UA: 1.025
Urobilinogen, UA: 0.2

## 2013-12-12 LAB — BASIC METABOLIC PANEL
BUN: 16 mg/dL (ref 6–23)
CO2: 26 mEq/L (ref 19–32)
Calcium: 9.2 mg/dL (ref 8.4–10.5)
Chloride: 104 mEq/L (ref 96–112)
Creatinine, Ser: 1.2 mg/dL (ref 0.4–1.5)
GFR: 81.82 mL/min (ref >60.00)
GLUCOSE: 81 mg/dL (ref 70–99)
Potassium: 4 mEq/L (ref 3.5–5.1)
SODIUM: 138 meq/L (ref 135–145)

## 2013-12-12 LAB — TSH: TSH: 0.78 u[IU]/mL (ref 0.35–5.50)

## 2013-12-12 LAB — PSA: PSA: 1.18 ng/mL (ref 0.10–4.00)

## 2013-12-12 MED ORDER — LISINOPRIL-HYDROCHLOROTHIAZIDE 20-25 MG PO TABS: ORAL_TABLET | ORAL | Status: DC

## 2013-12-12 MED ORDER — DOXAZOSIN MESYLATE 8 MG PO TABS: ORAL_TABLET | ORAL | Status: DC

## 2013-12-12 MED ORDER — VARDENAFIL HCL 20 MG PO TABS: 20.0000 mg | ORAL_TABLET | Freq: Every day | ORAL | Status: DC | PRN

## 2013-12-12 MED ORDER — SIMVASTATIN 20 MG PO TABS: 20.0000 mg | ORAL_TABLET | Freq: Every day | ORAL | Status: DC

## 2013-12-12 NOTE — Telephone Encounter (Signed)
appt with Dr Christella HartiganJacobs tomorrow 12/13/13

## 2013-12-12 NOTE — Progress Notes (Signed)
Pre visit review using our clinic review tool, if applicable. No additional management support is needed unless otherwise documented below in the visit note. 

## 2013-12-12 NOTE — Patient Instructions (Signed)
Take milk of magnesia or prune juice daily  Drink lots of water  Labs today  Continue your other medications  Levitra,,,,,,,,,, [email or web address]  Because of the bloating and blood in her bowel movements we will get you set up for a gastro-consult ASAP

## 2013-12-12 NOTE — Progress Notes (Signed)
   Subjective:    Patient ID: Jared Bryant, male    DOB: 1945-11-22, 68 y.o.   MRN: [id number]  HPI [name] is a 68 year old male nonsmoker who comes in today for a Medicare wellness examination because of a history of hypertension, hyperlipidemia, erectile dysfunction, and a new problem of abdominal bloating  He says over the past 4 weeks he's noted increasing girth of his abdomen a sensation of bloating and aching. He's had no fever chills nausea vomiting or diarrhea. He only has 3 bowel movements a week. This is a change for him.  We've recommended colonoscopies in the past but he's never for had one done  Cognitive function is normal he walks on a regular basis home health safety reviewed no issues identified, no guns in the house, he does have a health care power of attorney and living well  He gets routine eye care, dental care, vaccinations updated by Fleet Contrasachel   Review of Systems  Constitutional: Negative.   HENT: Negative.   Eyes: Negative.   Respiratory: Negative.   Cardiovascular: Negative.   Gastrointestinal: Negative.   Genitourinary: Negative.   Musculoskeletal: Negative.   Skin: Negative.   Neurological: Negative.   Psychiatric/Behavioral: Negative.        Objective:   Physical Exam  Nursing note and vitals reviewed. Constitutional: He is oriented to person, place, and time. He appears well-developed and well-nourished.  HENT:  Head: Normocephalic and atraumatic.  Right Ear: External ear normal.  Left Ear: External ear normal.  Nose: Nose normal.  Mouth/Throat: Oropharynx is clear and moist.  Eyes: Conjunctivae and EOM are normal. Pupils are equal, round, and reactive to light.  Neck: Normal range of motion. Neck supple. No JVD present. No tracheal deviation present. No thyromegaly present.  Cardiovascular: Normal rate, regular rhythm, normal heart sounds and intact distal pulses.  Exam reveals no gallop and no friction rub.   No murmur  heard. Pulmonary/Chest: Effort normal and breath sounds normal. No stridor. No respiratory distress. He has no wheezes. He has no rales. He exhibits no tenderness.  Abdominal: Soft. Bowel sounds are normal. He exhibits no distension and no mass. There is no tenderness. There is no rebound and no guarding.  Genitourinary: Prostate normal and penis normal. Guaiac positive stool. No penile tenderness.  Musculoskeletal: Normal range of motion. He exhibits no edema and no tenderness.  Lymphadenopathy:    He has no cervical adenopathy.  Neurological: He is alert and oriented to person, place, and time. He has normal reflexes. No cranial nerve deficit. He exhibits normal muscle tone.  Skin: Skin is warm and dry. No rash noted. No erythema. No pallor.  Psychiatric: He has a normal mood and affect. His behavior is normal. Judgment and thought content normal.          Assessment & Plan:  Hypertension at goal continue current therapy  Hyperlipidemia goal continue current therapy  Erectile dysfunction continue current therapy  4 week history of abdominal bloating positive stool guaiac refer to GI stat

## 2013-12-13 ENCOUNTER — Encounter: Payer: Self-pay | Admitting: Gastroenterology

## 2013-12-13 ENCOUNTER — Ambulatory Visit (INDEPENDENT_AMBULATORY_CARE_PROVIDER_SITE_OTHER): Payer: Medicare Other | Admitting: Gastroenterology

## 2013-12-13 ENCOUNTER — Telehealth: Payer: Self-pay | Admitting: Family Medicine

## 2013-12-13 VITALS — BP 130/80 | HR 60 | Ht 70.0 in | Wt 222.2 lb

## 2013-12-13 DIAGNOSIS — K5900 Constipation, unspecified: Secondary | ICD-10-CM

## 2013-12-13 DIAGNOSIS — R195 Other fecal abnormalities: Secondary | ICD-10-CM

## 2013-12-13 MED ORDER — MOVIPREP 100 G PO SOLR: 1.0000 | Freq: Once | ORAL | Status: DC

## 2013-12-13 NOTE — Patient Instructions (Addendum)
You will be set up for a colonoscopy (for hemocult positive stool, constipation). The prostate medicines you have started MAY be causing your constipation.  You should consider stopping them for 2 weeks, then restarting to see if you have same issues after restarting. If stopping the supplement does not help, then please try taking citrucel (orange flavored) powder fiber supplement.  This may cause some bloating at first but that usually goes away. Begin with a small spoonful and work your way up to a large, heaping spoonful daily over a week.

## 2013-12-13 NOTE — Progress Notes (Signed)
HPI: This is a   very pleasant 68 year old man whom I am meeting for the first time today.  No overt gi bleeding.  About 2 weeks ago, he was at work, he did some extra wiping with toilett paper.  Was a bit sore.  Has has jhad lower abd pains for 3 weeks. HAs been gaining a lot of weight.  When pushes on his belly it hurts.  Has been coughing a lot, noticed some blood on his mouth. Left sided tooth upper, will bleed whenever he brushes. Has not yet set up appt with a dentist.  Was found to have Hemoccult-positive stool on rectal examination by his primary care physician last week. CBC last week was normal.  Has been constipated lately, 3-4 weeks.  Started taking prostate OTC medicine, vits, herbs. Started about 4 weeks.  Constipation started shortly after taking these new vits, meds.  Took prune juice yesterday, has really helped.  Review of systems: Pertinent positive and negative review of systems were noted in the above HPI section. Complete review of systems was performed and was otherwise normal.    Past Medical History  Diagnosis Date  . Hyperlipidemia   . Hypertension   . BPH (benign prostatic hypertrophy)   . ED (erectile dysfunction)     Past Surgical History  Procedure Laterality Date  . Tonsillectomy      Current Outpatient Prescriptions  Medication Sig Dispense Refill  . doxazosin (CARDURA) 8 MG tablet TAKE 1 TABLET BY MOUTH DAILY AT BEDTIME  90 tablet  3  . lisinopril-hydrochlorothiazide (PRINZIDE,ZESTORETIC) 20-25 MG per tablet 1 by mouth every morning  100 tablet  3  . simvastatin (ZOCOR) 20 MG tablet Take 1 tablet (20 mg total) by mouth daily.  90 tablet  3  . vardenafil (LEVITRA) 20 MG tablet Take 1 tablet (20 mg total) by mouth daily as needed for erectile dysfunction.  10 tablet  6   No current facility-administered medications for this visit.    Allergies as of 12/13/2013  . (No Known Allergies)    Family History  Problem Relation Age of Onset   . Heart disease Mother   . Heart disease Father     History   Social History  . Marital Status: Single    Spouse Name: N/A    Number of Children: N/A  . Years of Education: N/A   Occupational History  . retired    Social History Main Topics  . Smoking status: Never Smoker   . Smokeless tobacco: Never Used  . Alcohol Use: No  . Drug Use: No  . Sexual Activity: Not on file   Other Topics Concern  . Not on file   Social History Narrative  . No narrative on file       Physical Exam: BP 130/80  Pulse 60  Ht 5\' 10"  (1.778 m)  Wt 222 lb 3.2 oz (100.789 kg)  BMI 31.88 kg/m2 Constitutional: generally well-appearing Psychiatric: alert and oriented x3 Eyes: extraocular movements intact Mouth: oral pharynx moist, no lesions Neck: supple no lymphadenopathy Cardiovascular: heart regular rate and rhythm Lungs: clear to auscultation bilaterally Abdomen: soft, nontender, nondistended, no obvious ascites, no peritoneal signs, normal bowel sounds Extremities: no lower extremity edema bilaterally Skin: no lesions on visible extremities    Assessment and plan: 68 y.o. male with  Hemoccult-positive stool, change in bowels, constipation recently, lower bowel discomfort  The timing of his constipation and change in bowels seems to be related to his starting of some  prostate over-the-counter supplement medicines. He started her supplements about a month ago and his constipation, discomforts started about 3 weeks ago. I recommended he stop taking these for the next 2 weeks and see if he feels better. If that is not helpful I recommended he try fiber supplements on a daily basis. I also recommended that we proceed with colonoscopy at his soonest convenience to investigate his Hemoccult-positive stool and change in bowels.

## 2013-12-13 NOTE — Telephone Encounter (Signed)
Relevant patient education mailed to patient.  

## 2013-12-14 ENCOUNTER — Telehealth: Payer: Self-pay | Admitting: Gastroenterology

## 2013-12-14 NOTE — Telephone Encounter (Signed)
Pt states he had to pay 50.00 for the moviprep. Discussed with pt that the fifty was his copay. He wanted to make sure because his wife didn't have to pay for hers. Discussed with him that she may have had a different insurance.

## 2013-12-18 ENCOUNTER — Other Ambulatory Visit: Payer: Self-pay

## 2013-12-18 DIAGNOSIS — R195 Other fecal abnormalities: Secondary | ICD-10-CM

## 2013-12-19 ENCOUNTER — Encounter: Payer: Medicare Other | Admitting: Gastroenterology

## 2013-12-20 ENCOUNTER — Telehealth: Payer: Self-pay | Admitting: Gastroenterology

## 2013-12-20 NOTE — Telephone Encounter (Signed)
Pt [facility] been [facility]

## 2013-12-21 ENCOUNTER — Encounter (HOSPITAL_COMMUNITY): Payer: Self-pay

## 2013-12-21 ENCOUNTER — Ambulatory Visit (HOSPITAL_COMMUNITY)
Admission: RE | Admit: 2013-12-21 | Discharge: 2013-12-21 | Disposition: A | Discharge status: Home / Self Care | Payer: Medicare Other | Source: Ambulatory Visit | Attending: Gastroenterology | Admitting: Gastroenterology

## 2013-12-21 ENCOUNTER — Encounter (HOSPITAL_COMMUNITY)
Admission: RE | Disposition: A | Discharge status: Home / Self Care | Payer: Self-pay | Source: Ambulatory Visit | Attending: Gastroenterology

## 2013-12-21 DIAGNOSIS — R05 Cough: Secondary | ICD-10-CM | POA: Insufficient documentation

## 2013-12-21 DIAGNOSIS — K5900 Constipation, unspecified: Secondary | ICD-10-CM | POA: Diagnosis not present

## 2013-12-21 DIAGNOSIS — R195 Other fecal abnormalities: Secondary | ICD-10-CM | POA: Diagnosis not present

## 2013-12-21 DIAGNOSIS — E785 Hyperlipidemia, unspecified: Secondary | ICD-10-CM | POA: Insufficient documentation

## 2013-12-21 DIAGNOSIS — K089 Disorder of teeth and supporting structures, unspecified: Secondary | ICD-10-CM | POA: Insufficient documentation

## 2013-12-21 DIAGNOSIS — Z79899 Other long term (current) drug therapy: Secondary | ICD-10-CM | POA: Insufficient documentation

## 2013-12-21 DIAGNOSIS — I10 Essential (primary) hypertension: Secondary | ICD-10-CM | POA: Insufficient documentation

## 2013-12-21 DIAGNOSIS — R109 Unspecified abdominal pain: Secondary | ICD-10-CM | POA: Insufficient documentation

## 2013-12-21 DIAGNOSIS — R198 Other specified symptoms and signs involving the digestive system and abdomen: Secondary | ICD-10-CM | POA: Diagnosis not present

## 2013-12-21 DIAGNOSIS — K5730 Diverticulosis of large intestine without perforation or abscess without bleeding: Secondary | ICD-10-CM | POA: Diagnosis not present

## 2013-12-21 DIAGNOSIS — R059 Cough, unspecified: Secondary | ICD-10-CM | POA: Insufficient documentation

## 2013-12-21 HISTORY — PX: COLONOSCOPY: SHX5424

## 2013-12-21 SURGERY — COLONOSCOPY
Anesthesia: Moderate Sedation

## 2013-12-21 MED ORDER — FENTANYL CITRATE 0.05 MG/ML IJ SOLN
INTRAMUSCULAR | Status: DC | PRN
  Administered 2013-12-21 (×2): 25 ug via INTRAVENOUS

## 2013-12-21 MED ORDER — FENTANYL CITRATE 0.05 MG/ML IJ SOLN
INTRAMUSCULAR | Status: AC
  Filled 2013-12-21: qty 4

## 2013-12-21 MED ORDER — SODIUM CHLORIDE 0.9 % IV SOLN
INTRAVENOUS | Status: DC
  Administered 2013-12-21: 500 mL via INTRAVENOUS

## 2013-12-21 MED ORDER — DIPHENHYDRAMINE HCL 50 MG/ML IJ SOLN
INTRAMUSCULAR | Status: AC
  Filled 2013-12-21: qty 1

## 2013-12-21 MED ORDER — MIDAZOLAM HCL 10 MG/2ML IJ SOLN
INTRAMUSCULAR | Status: DC | PRN
  Administered 2013-12-21 (×2): 2 mg via INTRAVENOUS

## 2013-12-21 MED ORDER — MIDAZOLAM HCL 10 MG/2ML IJ SOLN
INTRAMUSCULAR | Status: AC
  Filled 2013-12-21: qty 2

## 2013-12-21 NOTE — H&P (View-Only) (Signed)
HPI: This is a   very pleasant 68 year old man whom I am meeting for the first time today.  No overt gi bleeding.  About 2 weeks ago, he was at work, he did some extra wiping with toilett paper.  Was a bit sore.  Has has jhad lower abd pains for 3 weeks. HAs been gaining a lot of weight.  When pushes on his belly it hurts.  Has been coughing a lot, noticed some blood on his mouth. Left sided tooth upper, will bleed whenever he brushes. Has not yet set up appt with a dentist.  Was found to have Hemoccult-positive stool on rectal examination by his primary care physician last week. CBC last week was normal.  Has been constipated lately, 3-4 weeks.  Started taking prostate OTC medicine, vits, herbs. Started about 4 weeks.  Constipation started shortly after taking these new vits, meds.  Took prune juice yesterday, has really helped.  Review of systems: Pertinent positive and negative review of systems were noted in the above HPI section. Complete review of systems was performed and was otherwise normal.    Past Medical History  Diagnosis Date  . Hyperlipidemia   . Hypertension   . BPH (benign prostatic hypertrophy)   . ED (erectile dysfunction)     Past Surgical History  Procedure Laterality Date  . Tonsillectomy      Current Outpatient Prescriptions  Medication Sig Dispense Refill  . doxazosin (CARDURA) 8 MG tablet TAKE 1 TABLET BY MOUTH DAILY AT BEDTIME  90 tablet  3  . lisinopril-hydrochlorothiazide (PRINZIDE,ZESTORETIC) 20-25 MG per tablet 1 by mouth every morning  100 tablet  3  . simvastatin (ZOCOR) 20 MG tablet Take 1 tablet (20 mg total) by mouth daily.  90 tablet  3  . vardenafil (LEVITRA) 20 MG tablet Take 1 tablet (20 mg total) by mouth daily as needed for erectile dysfunction.  10 tablet  6   No current facility-administered medications for this visit.    Allergies as of 12/13/2013  . (No Known Allergies)    Family History  Problem Relation Age of Onset   . Heart disease Mother   . Heart disease Father     History   Social History  . Marital Status: Single    Spouse Name: N/A    Number of Children: N/A  . Years of Education: N/A   Occupational History  . retired    Social History Main Topics  . Smoking status: Never Smoker   . Smokeless tobacco: Never Used  . Alcohol Use: No  . Drug Use: No  . Sexual Activity: Not on file   Other Topics Concern  . Not on file   Social History Narrative  . No narrative on file       Physical Exam: BP 130/80  Pulse 60  Ht 5\' 10"  (1.778 m)  Wt 222 lb 3.2 oz (100.789 kg)  BMI 31.88 kg/m2 Constitutional: generally well-appearing Psychiatric: alert and oriented x3 Eyes: extraocular movements intact Mouth: oral pharynx moist, no lesions Neck: supple no lymphadenopathy Cardiovascular: heart regular rate and rhythm Lungs: clear to auscultation bilaterally Abdomen: soft, nontender, nondistended, no obvious ascites, no peritoneal signs, normal bowel sounds Extremities: no lower extremity edema bilaterally Skin: no lesions on visible extremities    Assessment and plan: 68 y.o. male with  Hemoccult-positive stool, change in bowels, constipation recently, lower bowel discomfort  The timing of his constipation and change in bowels seems to be related to his starting of some  prostate over-the-counter supplement medicines. He started her supplements about a month ago and his constipation, discomforts started about 3 weeks ago. I recommended he stop taking these for the next 2 weeks and see if he feels better. If that is not helpful I recommended he try fiber supplements on a daily basis. I also recommended that we proceed with colonoscopy at his soonest convenience to investigate his Hemoccult-positive stool and change in bowels.

## 2013-12-21 NOTE — Interval H&P Note (Signed)
History and Physical Interval Note:  12/21/2013 9:48 AM  Jared Bryant  has presented today for surgery, with the diagnosis of heme positive stool  The various methods of treatment have been discussed with the patient and family. After consideration of risks, benefits and other options for treatment, the patient has consented to  Procedure(s): COLONOSCOPY (N/A) as a surgical intervention .  The patient's history has been reviewed, patient examined, no change in status, stable for surgery.  I have reviewed the patient's chart and labs.  Questions were answered to the patient's satisfaction.     Rachael FeeJacobs, Daniel P

## 2013-12-21 NOTE — Op Note (Signed)
[facility] [address]  [place], [id number]   COLONOSCOPY PROCEDURE REPORT  PATIENT: Jared HaberMorgan, Jared L.  MR#: [id number] BIRTHDATE: November 11, 1945 , 68  yrs. old GENDER: Male ENDOSCOPIST: Rachael Feeaniel P Kamrin Spath, MD REFERRED HQ:[id number]:Jeffrey Shawnie DapperA Todd, M.D. PROCEDURE DATE:  12/21/2013 PROCEDURE:   Colonoscopy, diagnostic First Screening Colonoscopy - Avg.  risk and is 50 yrs.  old or older - No.  Prior Negative Screening - Now for repeat screening. N/A  History of Adenoma - Now for follow-up colonoscopy & has been > or = to 3 yrs.  N/A  Polyps Removed Today? No.  Recommend repeat exam, <10 yrs? No. ASA CLASS:   Class II INDICATIONS:FOBT positive stool. MEDICATIONS: Fentanyl 50 mcg IV and Versed 4 mg IV  DESCRIPTION OF PROCEDURE:   After the risks benefits and alternatives of the procedure were thoroughly explained, informed consent was obtained.  A digital rectal exam revealed no abnormalities of the rectum.   The Pentax Ped Colon [id number] endoscope was introduced through the anus and advanced to the cecum, which was identified by both the appendix and ileocecal valve. No adverse events experienced.   The quality of the prep was good.  The instrument was then slowly withdrawn as the colon was fully examined.    COLON FINDINGS: There were diverticulum throughout the colon.  The examination was otherwise normal.  Retroflexed views revealed no abnormalities. The time to cecum=3 minutes 00 seconds.  Withdrawal time=10 minutes 00 seconds.  The scope was withdrawn and the procedure completed. COMPLICATIONS: There were no complications.  ENDOSCOPIC IMPRESSION: There were diverticulum throughout the colon. The examination was otherwise normal.  No polyps or cancers  RECOMMENDATIONS: You should continue to follow colorectal cancer screening guidelines for "routine risk" patients with a repeat colonoscopy in 10 years.   eSigned:  Rachael Feeaniel P Yecenia Dalgleish, MD 12/21/2013 10:30  AM

## 2013-12-21 NOTE — Discharge Instructions (Signed)
YOU HAD AN ENDOSCOPIC PROCEDURE TODAY: Refer to the procedure report that was given to you for any specific questions about what was found during the examination.  If the procedure report does not answer your questions, please call your gastroenterologist to clarify. ° °YOU SHOULD EXPECT: Some feelings of bloating in the abdomen. Passage of more gas than usual.  Walking can help get rid of the air that was put into your GI tract during the procedure and reduce the bloating. If you had a lower endoscopy (such as a colonoscopy or flexible sigmoidoscopy) you may notice spotting of blood in your stool or on the toilet paper.  ° °DIET: Your first meal following the procedure should be a light meal and then it is ok to progress to your normal diet.  A half-sandwich or bowl of soup is an example of a good first meal.  Heavy or fried foods are harder to digest and may make you feel nasueas or bloated.  Drink plenty of fluids but you should avoid alcoholic beverages for 24 hours. ° °ACTIVITY: Your care partner should take you home directly after the procedure.  You should plan to take it easy, moving slowly for the rest of the day.  You can resume normal activity the day after the procedure however you should NOT DRIVE or use heavy machinery for 24 hours (because of the sedation medicines used during the test).   ° °SYMPTOMS TO REPORT IMMEDIATELY  °A gastroenterologist can be reached at any hour.  Please call your doctor's office for any of the following symptoms: ° °· Following lower endoscopy (colonoscopy, flexible sigmoidoscopy) ° Excessive amounts of blood in the stool ° Significant tenderness, worsening of abdominal pains ° Swelling of the abdomen that is new, acute ° Fever of 100° or higher °· Following upper endoscopy (EGD, EUS, ERCP) ° Vomiting of blood or coffee ground material ° New, significant abdominal pain ° New, significant chest pain or pain under the shoulder blades ° Painful or persistently difficult  swallowing ° New shortness of breath ° Black, tarry-looking stools ° °FOLLOW UP: °If any biopsies were taken you will be contacted by phone or by letter within the next 1-3 weeks.  Call your gastroenterologist if you have not heard about the biopsies in 3 weeks.  °Please also call your gastroenterologist's office with any specific questions about appointments or follow up tests. °Colonoscopy, Care After °Refer to this sheet in the next few weeks. These instructions provide you with information on caring for yourself after your procedure. Your health care provider may also give you more specific instructions. Your treatment has been planned according to current medical practices, but problems sometimes occur. Call your health care provider if you have any problems or questions after your procedure. °WHAT TO EXPECT AFTER THE PROCEDURE  °After your procedure, it is typical to have the following: °· A small amount of blood in your stool. °· Moderate amounts of gas and mild abdominal cramping or bloating. °HOME CARE INSTRUCTIONS °· Do not drive, operate machinery, or sign important documents for 24 hours. °· You may shower and resume your regular physical activities, but move at a slower pace for the first 24 hours. °· Take frequent rest periods for the first 24 hours. °· Walk around or put a warm pack on your abdomen to help reduce abdominal cramping and bloating. °· Drink enough fluids to keep your urine clear or pale yellow. °· You may resume your normal diet as instructed by your health   care provider. Avoid heavy or fried foods that are hard to digest. °· Avoid drinking alcohol for 24 hours or as instructed by your health care provider. °· Only take over-the-counter or prescription medicines as directed by your health care provider. °· If a tissue sample (biopsy) was taken during your procedure: °· Do not take aspirin or blood thinners for 7 days, or as instructed by your health care provider. °· Do not drink alcohol  for 7 days, or as instructed by your health care provider. °· Eat soft foods for the first 24 hours. °SEEK MEDICAL CARE IF: °You have persistent spotting of blood in your stool 2 3 days after the procedure. °SEEK IMMEDIATE MEDICAL CARE IF: °· You have more than a small spotting of blood in your stool. °· You pass large blood clots in your stool. °· Your abdomen is swollen (distended). °· You have nausea or vomiting. °· You have a fever. °· You have increasing abdominal pain that is not relieved with medicine. °Document Released: 06/02/2004 Document Revised: 08/09/2013 Document Reviewed: 06/26/2013 °Exit[employer]® Patient Information ©2014 [employer]. ° °

## 2013-12-22 ENCOUNTER — Encounter (HOSPITAL_COMMUNITY): Payer: Self-pay | Admitting: Gastroenterology

## 2013-12-29 ENCOUNTER — Telehealth: Payer: Self-pay | Admitting: Gastroenterology

## 2013-12-29 NOTE — Telephone Encounter (Signed)
12/21/13 work note

## 2013-12-29 NOTE — Telephone Encounter (Signed)
Left message on machine to call back  

## 2013-12-29 NOTE — Telephone Encounter (Signed)
Work note printed and patient will come by to pick up today.

## 2014-02-27 ENCOUNTER — Other Ambulatory Visit: Payer: Self-pay | Admitting: Family Medicine

## 2014-11-06 ENCOUNTER — Other Ambulatory Visit: Payer: Self-pay | Admitting: Family Medicine

## 2014-11-08 ENCOUNTER — Ambulatory Visit (INDEPENDENT_AMBULATORY_CARE_PROVIDER_SITE_OTHER): Payer: Medicare Other | Admitting: Family Medicine

## 2014-11-08 ENCOUNTER — Encounter: Payer: Self-pay | Admitting: Family Medicine

## 2014-11-08 VITALS — BP 110/80 | Temp 97.4°F | Wt 227.0 lb

## 2014-11-08 DIAGNOSIS — R140 Abdominal distension (gaseous): Secondary | ICD-10-CM

## 2014-11-08 NOTE — Progress Notes (Signed)
   Subjective:    Patient ID: Jared HaberSidney L Bryant, male    DOB: 07-Nov-1945, [age] y.o.   MRN: [id number]  HPI Jared ParodySidney is a 69 year old married male nonsmoker who comes in today for evaluation of bloating and constipation  He'll colonoscopy about a year ago which was normal. I reviewed the findings. He had some diverticuli but no other abnormal lesions. Over the last 2 months she's had some bloating and constipation. He's tried over-the-counter medicines to no avail. He's had no fever chills nausea vomiting diarrhea weight loss or night sweats.   Review of Systems    review of systems otherwise negative Objective:   Physical Exam  Well-developed well-nourished male no acute distress vital signs stable he is afebrile in the supine position the abdomen appears normal bowel sounds normal liver spleen kidneys not enlarged I can appreciate no masses. Rectal exam shows 1+ symmetrical nonnodular BPH is no stool in the rectum guaiac negative      Assessment & Plan:  Abdominal bloating and constipation........[name]. begin bowel program.......[name]. referred back to GI for further evaluation

## 2014-11-08 NOTE — Progress Notes (Signed)
Pre visit review using our clinic review tool, if applicable. No additional management support is needed unless otherwise documented below in the visit note. 

## 2014-11-08 NOTE — Patient Instructions (Signed)
Drink 32 ounces of water daily  Milk of magnesia........... 2 tablespoons twice daily  Prune juice....... 4 ounces daily in the morning  Walk 30 minutes daily  Call and make an appointment to see your gastroenterologist for follow-up

## 2014-12-31 ENCOUNTER — Encounter: Payer: Medicare Other | Admitting: Family Medicine

## 2015-01-02 ENCOUNTER — Encounter: Payer: Self-pay | Admitting: Family Medicine

## 2015-01-02 ENCOUNTER — Ambulatory Visit (INDEPENDENT_AMBULATORY_CARE_PROVIDER_SITE_OTHER): Payer: Medicare Other | Admitting: Family Medicine

## 2015-01-02 VITALS — BP 130/98 | Temp 97.3°F | Ht 70.0 in | Wt 229.0 lb

## 2015-01-02 DIAGNOSIS — Z23 Encounter for immunization: Secondary | ICD-10-CM | POA: Diagnosis not present

## 2015-01-02 DIAGNOSIS — R351 Nocturia: Secondary | ICD-10-CM

## 2015-01-02 DIAGNOSIS — N401 Enlarged prostate with lower urinary tract symptoms: Secondary | ICD-10-CM

## 2015-01-02 DIAGNOSIS — Z0000 Encounter for general adult medical examination without abnormal findings: Secondary | ICD-10-CM

## 2015-01-02 DIAGNOSIS — I10 Essential (primary) hypertension: Secondary | ICD-10-CM | POA: Diagnosis not present

## 2015-01-02 DIAGNOSIS — E785 Hyperlipidemia, unspecified: Secondary | ICD-10-CM

## 2015-01-02 DIAGNOSIS — R1314 Dysphagia, pharyngoesophageal phase: Secondary | ICD-10-CM

## 2015-01-02 LAB — LIPID PANEL
Cholesterol: 147 mg/dL (ref 0–200)
HDL: 31 mg/dL — ABNORMAL LOW (ref >39.00)
LDL CALC: 97 mg/dL (ref 0–99)
NonHDL: 116
Total CHOL/HDL Ratio: 5
Triglycerides: 96 mg/dL (ref 0.0–149.0)
VLDL: 19.2 mg/dL (ref 0.0–40.0)

## 2015-01-02 LAB — POCT URINALYSIS DIPSTICK
Bilirubin, UA: NEGATIVE
Blood, UA: NEGATIVE
Glucose, UA: NEGATIVE
Ketones, UA: NEGATIVE
Leukocytes, UA: NEGATIVE
Nitrite, UA: NEGATIVE
PROTEIN UA: NEGATIVE
SPEC GRAV UA: 1.015
Urobilinogen, UA: 0.2
pH, UA: 5

## 2015-01-02 LAB — BASIC METABOLIC PANEL
BUN: 17 mg/dL (ref 6–23)
CO2: 30 mEq/L (ref 19–32)
Calcium: 9.8 mg/dL (ref 8.4–10.5)
Chloride: 104 mEq/L (ref 96–112)
Creatinine, Ser: 1.35 mg/dL (ref 0.40–1.50)
GFR: 67.78 mL/min (ref >60.00)
Glucose, Bld: 87 mg/dL (ref 70–99)
Potassium: 3.9 mEq/L (ref 3.5–5.1)
SODIUM: 139 meq/L (ref 135–145)

## 2015-01-02 LAB — CBC WITH DIFFERENTIAL/PLATELET
Basophils Absolute: 0 10*3/uL (ref 0.0–0.1)
Basophils Relative: 0.7 % (ref 0.0–3.0)
EOS ABS: 0.1 10*3/uL (ref 0.0–0.7)
EOS PCT: 1.5 % (ref 0.0–5.0)
HCT: 46.1 % (ref 39.0–52.0)
Hemoglobin: 15.7 g/dL (ref 13.0–17.0)
LYMPHS PCT: 23.1 % (ref 12.0–46.0)
Lymphs Abs: 1.7 10*3/uL (ref 0.7–4.0)
MCHC: 34.1 g/dL (ref 30.0–36.0)
MCV: 89.2 fl (ref 78.0–100.0)
MONOS PCT: 11.1 % (ref 3.0–12.0)
Monocytes Absolute: 0.8 10*3/uL (ref 0.1–1.0)
Neutro Abs: 4.5 10*3/uL (ref 1.4–7.7)
Neutrophils Relative %: 63.6 % (ref 43.0–77.0)
PLATELETS: 222 10*3/uL (ref 150.0–400.0)
RBC: 5.17 Mil/uL (ref 4.22–5.81)
RDW: 14.6 % (ref 11.5–15.5)
WBC: 7.1 10*3/uL (ref 4.0–10.5)

## 2015-01-02 LAB — HEPATIC FUNCTION PANEL
ALBUMIN: 4.3 g/dL (ref 3.5–5.2)
ALK PHOS: 48 U/L (ref 39–117)
ALT: 39 U/L (ref 0–53)
AST: 26 U/L (ref 0–37)
Bilirubin, Direct: 0.1 mg/dL (ref 0.0–0.3)
Total Bilirubin: 0.6 mg/dL (ref 0.2–1.2)
Total Protein: 7.4 g/dL (ref 6.0–8.3)

## 2015-01-02 LAB — TSH: TSH: 0.62 u[IU]/mL (ref 0.35–4.50)

## 2015-01-02 LAB — PSA: PSA: 1.1 ng/mL (ref 0.10–4.00)

## 2015-01-02 MED ORDER — LISINOPRIL-HYDROCHLOROTHIAZIDE 20-25 MG PO TABS: ORAL_TABLET | ORAL | Status: DC

## 2015-01-02 MED ORDER — SIMVASTATIN 20 MG PO TABS: ORAL_TABLET | ORAL | Status: DC

## 2015-01-02 MED ORDER — AMLODIPINE BESYLATE 2.5 MG PO TABS: 2.5000 mg | ORAL_TABLET | Freq: Every day | ORAL | Status: DC

## 2015-01-02 MED ORDER — DOXAZOSIN MESYLATE 8 MG PO TABS: 8.0000 mg | ORAL_TABLET | Freq: Every day | ORAL | Status: DC

## 2015-01-02 NOTE — Progress Notes (Signed)
Pre visit review using our clinic review tool, if applicable. No additional management support is needed unless otherwise documented below in the visit note. 

## 2015-01-02 NOTE — Progress Notes (Signed)
   Subjective:    Patient ID: Jared Bryant, male    DOB: 11-Jul-1946, 69 y.o.   MRN: [id number]  HPI Mr. Lequita HaltMorgan is a 69 year old married male nonsmoker who comes in today for general physical examination because of a history of hypertension and hyperlipidemia  He takes Zestoretic 20-25 daily for hypertension along with 8 mg of Cardura. BP is running 130/98. His systolics at home are normal his diastolics at home are all elevated. We'll add some Norvasc  He takes Zocor 20 mg daily for hyperlipidemia we'll check lipid panel today  He does not get routine eye care.... Recommended Dr. Hazle Quantigby... Does get regular dental care. Colonoscopy 2015 normal  Vaccinations up-to-date... 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 Home health safety reviewed no issues identified, no guns in the house, he does not have a healthcare power of attorney nor living well. Encouraged to do so  He says he feels well except he is having difficulty swallowing for the past 3 weeks. We'll get him set up to see the folks in GI     Review of Systems  Constitutional: Negative.   HENT: Negative.   Eyes: Negative.   Respiratory: Negative.   Cardiovascular: Negative.   Gastrointestinal: Negative.   Endocrine: Negative.   Genitourinary: Negative.   Musculoskeletal: Negative.   Skin: Negative.   Allergic/Immunologic: Negative.   Neurological: Negative.   Hematological: Negative.   Psychiatric/Behavioral: Negative.        Objective:   Physical Exam  Constitutional: He is oriented to person, place, and time. He appears well-developed and well-nourished.  HENT:  Head: Normocephalic and atraumatic.  Right Ear: External ear normal.  Left Ear: External ear normal.  Nose: Nose normal.  Mouth/Throat: Oropharynx is clear and moist.  Eyes: Conjunctivae and EOM are normal. Pupils are  equal, round, and reactive to light.  Neck: Normal range of motion. Neck supple. No JVD present. No tracheal deviation present. No thyromegaly present.  Cardiovascular: Normal rate, regular rhythm, normal heart sounds and intact distal pulses.  Exam reveals no gallop and no friction rub.   No murmur heard. No carotid or aortic bruits peripheral pulses 2+ and symmetrical  Pulmonary/Chest: Effort normal and breath sounds normal. No stridor. No respiratory distress. He has no wheezes. He has no rales. He exhibits no tenderness.  Abdominal: Soft. Bowel sounds are normal. He exhibits no distension and no mass. There is no tenderness. There is no rebound and no guarding.  Genitourinary: Rectum normal and penis normal. Guaiac negative stool. No penile tenderness.  1+ symmetrical nonnodular BPH  Musculoskeletal: Normal range of motion. He exhibits no edema or tenderness.  Lymphadenopathy:    He has no cervical adenopathy.  Neurological: He is alert and oriented to person, place, and time. He has normal reflexes. No cranial nerve deficit. He exhibits normal muscle tone.  Skin: Skin is warm and dry. No rash noted. No erythema. No pallor.  Psychiatric: He has a normal mood and affect. His behavior is normal. Judgment and thought content normal.  Nursing note and vitals reviewed.         Assessment & Plan:  Healthy male  Hypertension not at goal add Norvasc 2.5 mg........ BP check every morning follow-up in 4 weeks  Hyperlipidemia continue Zocor..... Check labs  BPH with nocturia........ check labs  Early cataracts......... I consult Dr. Hazle Quantigby

## 2015-01-02 NOTE — Patient Instructions (Addendum)
Cardura 8 mg.... One daily in the morning  Zestoretic 20-25.[name].[name].[name].[name]. 1 daily in the morning  Norvasc 2.5 mg.......[name]. 1 daily in the morning  Check your blood pressure daily in the morning  Return mid-April for follow-up with the data and the device  Labs today.......[name]. we will call you if there is anything abnormal  Call Dr. Hazle Quantigby.......[name]. ophthalmologist for an eye exam  Healthcare power of attorney and living well  [employer]inger.........[name]. our new adult nurse practitioner we'll do year next physical  We were requested GI consult ASAP,,,,,,,,,,,, in the meantime soft diet chew your food well and drink lots of water

## 2015-02-07 ENCOUNTER — Encounter: Payer: Self-pay | Admitting: Gastroenterology

## 2015-02-19 ENCOUNTER — Ambulatory Visit (INDEPENDENT_AMBULATORY_CARE_PROVIDER_SITE_OTHER): Payer: Medicare Other | Admitting: Family Medicine

## 2015-02-19 ENCOUNTER — Encounter: Payer: Self-pay | Admitting: Family Medicine

## 2015-02-19 VITALS — BP 136/88 | Temp 98.3°F | Wt 227.3 lb

## 2015-02-19 DIAGNOSIS — I10 Essential (primary) hypertension: Secondary | ICD-10-CM | POA: Diagnosis not present

## 2015-02-19 NOTE — Progress Notes (Signed)
   Subjective:    Patient ID: Jared Bryant, male    DRobyn HaberOB: [birth year], 69 y.o.   MRN: [id number]  HPI [name] is a 69 year old male who comes in today for reevaluation of hypertension  We saw him a couple weeks ago for physical examination. His blood pressure is not at goal. We added Norvasc 2.5 mg daily to Cardura 8 mg daily and Zestoretic 20-25 daily. Now BP normal today is 136/88   Review of Systems    review of systems otherwise negative Objective:   Physical Exam  Well-developed well-nourished male no acute distress vital signs stable he is afebrile BP right arm sitting position 136/88      Assessment & Plan:  Hypertension at goal.......... continue current therapy BP check weekly at home follow-up in

## 2015-02-19 NOTE — Progress Notes (Signed)
Pre visit review using our clinic review tool, if applicable. No additional management support is needed unless otherwise documented below in the visit note. 

## 2015-02-19 NOTE — Patient Instructions (Signed)
Continue current medications  Since your blood pressures normal just check it once weekly in the morning.............. say Sunday morning  BP goal 135/85 or less  Return for your annual physical examination sooner if any problems........... Tawana Scaleorey NAFZINGER....... is our new adult Publishing rights managernurse practitioner.

## 2015-05-16 ENCOUNTER — Encounter (HOSPITAL_COMMUNITY): Payer: Self-pay | Admitting: Vascular Surgery

## 2015-05-16 ENCOUNTER — Emergency Department (HOSPITAL_COMMUNITY)
Admission: EM | Admit: 2015-05-16 | Discharge: 2015-05-16 | Disposition: A | Discharge status: Home / Self Care | Payer: Medicare Other | Attending: Emergency Medicine | Admitting: Emergency Medicine

## 2015-05-16 ENCOUNTER — Emergency Department (HOSPITAL_COMMUNITY): Payer: Medicare Other

## 2015-05-16 DIAGNOSIS — Z79899 Other long term (current) drug therapy: Secondary | ICD-10-CM | POA: Insufficient documentation

## 2015-05-16 DIAGNOSIS — L989 Disorder of the skin and subcutaneous tissue, unspecified: Secondary | ICD-10-CM | POA: Insufficient documentation

## 2015-05-16 DIAGNOSIS — R0981 Nasal congestion: Secondary | ICD-10-CM | POA: Diagnosis not present

## 2015-05-16 DIAGNOSIS — R062 Wheezing: Secondary | ICD-10-CM | POA: Diagnosis not present

## 2015-05-16 DIAGNOSIS — L299 Pruritus, unspecified: Secondary | ICD-10-CM | POA: Diagnosis not present

## 2015-05-16 DIAGNOSIS — I10 Essential (primary) hypertension: Secondary | ICD-10-CM | POA: Diagnosis not present

## 2015-05-16 DIAGNOSIS — J3489 Other specified disorders of nose and nasal sinuses: Secondary | ICD-10-CM | POA: Diagnosis not present

## 2015-05-16 DIAGNOSIS — M79662 Pain in left lower leg: Secondary | ICD-10-CM | POA: Diagnosis not present

## 2015-05-16 DIAGNOSIS — R05 Cough: Secondary | ICD-10-CM | POA: Diagnosis not present

## 2015-05-16 DIAGNOSIS — Z87438 Personal history of other diseases of male genital organs: Secondary | ICD-10-CM | POA: Diagnosis not present

## 2015-05-16 DIAGNOSIS — R0789 Other chest pain: Secondary | ICD-10-CM | POA: Diagnosis not present

## 2015-05-16 DIAGNOSIS — R059 Cough, unspecified: Secondary | ICD-10-CM

## 2015-05-16 DIAGNOSIS — M7989 Other specified soft tissue disorders: Secondary | ICD-10-CM | POA: Insufficient documentation

## 2015-05-16 DIAGNOSIS — E785 Hyperlipidemia, unspecified: Secondary | ICD-10-CM | POA: Insufficient documentation

## 2015-05-16 MED ORDER — MUPIROCIN 2 % EX OINT: 1.0000 "application " | TOPICAL_OINTMENT | Freq: Two times a day (BID) | CUTANEOUS | Status: DC

## 2015-05-16 MED ORDER — ALBUTEROL SULFATE (2.5 MG/3ML) 0.083% IN NEBU
2.5000 mg | INHALATION_SOLUTION | Freq: Once | RESPIRATORY_TRACT | Status: AC
  Administered 2015-05-16: 2.5 mg via RESPIRATORY_TRACT
  Filled 2015-05-16: qty 3

## 2015-05-16 MED ORDER — HYDROCODONE-HOMATROPINE 5-1.5 MG/5ML PO SYRP: 5.0000 mL | ORAL_SOLUTION | Freq: Four times a day (QID) | ORAL | Status: DC | PRN

## 2015-05-16 MED ORDER — ALBUTEROL SULFATE HFA 108 (90 BASE) MCG/ACT IN AERS
2.0000 | INHALATION_SPRAY | RESPIRATORY_TRACT | Status: DC | PRN
  Filled 2015-05-16 (×2): qty 6.7

## 2015-05-16 MED ORDER — AEROCHAMBER PLUS FLO-VU MEDIUM MISC
1.0000 | Freq: Once | Status: AC
  Administered 2015-05-16: 1
  Filled 2015-05-16: qty 1

## 2015-05-16 NOTE — Discharge Instructions (Signed)
1. Medications: hycodan, mucinex OTC, bactroban, albuterol, hycodan, usual home medications 2. Treatment: rest, drink plenty of fluids, take tylenol or ibuprofen for fever control 3. Follow Up: Please followup with your primary doctor in 2-3 days for discussion of your diagnoses and further evaluation after today's visit; if you do not have a primary care doctor use the resource guide provided to find one; Return to the ER for high fevers, difficulty breathing or other concerning symptoms    Cough, Adult  A cough is a reflex that helps clear your throat and airways. It can help heal the body or may be a reaction to an irritated airway. A cough may only last 2 or 3 weeks (acute) or may last more than 8 weeks (chronic).  CAUSES Acute cough:  Viral or bacterial infections. Chronic cough:  Infections.  Allergies.  Asthma.  Post-nasal drip.  Smoking.  Heartburn or acid reflux.  Some medicines.  Chronic lung problems (COPD).  Cancer. SYMPTOMS   Cough.  Fever.  Chest pain.  Increased breathing rate.  High-pitched whistling sound when breathing (wheezing).  Colored mucus that you cough up (sputum). TREATMENT   A bacterial cough may be treated with antibiotic medicine.  A viral cough must run its course and will not respond to antibiotics.  Your caregiver may recommend other treatments if you have a chronic cough. HOME CARE INSTRUCTIONS   Only take over-the-counter or prescription medicines for pain, discomfort, or fever as directed by your caregiver. Use cough suppressants only as directed by your caregiver.  Use a cold steam vaporizer or humidifier in your bedroom or home to help loosen secretions.  Sleep in a semi-upright position if your cough is worse at night.  Rest as needed.  Stop smoking if you smoke. SEEK IMMEDIATE MEDICAL CARE IF:   You have pus in your sputum.  Your cough starts to worsen.  You cannot control your cough with suppressants and are  losing sleep.  You begin coughing up blood.  You have difficulty breathing.  You develop pain which is getting worse or is uncontrolled with medicine.  You have a fever. MAKE SURE YOU:   Understand these instructions.  Will watch your condition.  Will get help right away if you are not doing well or get worse. Document Released: 04/17/2011 Document Revised: 01/11/2012 Document Reviewed: 04/17/2011 [facility] Patient Information 2015 [place]. This information is not intended to replace advice given to you by your health care provider. Make sure you discuss any questions you have with your health care provider.

## 2015-05-16 NOTE — ED Notes (Addendum)
Pt reports to the ED for eval of possible spider bite to left posterior calf. He reports it happened approx 1 week ago and he has been soaking it in peroxide but it has not gotten much better. It is approx the size of a nickel. He is not diabetic. Area is scabbed over. Pt also reports some nasal and chest congestion. Denies any CP. Reports productive clear cough. Pt A&Ox4, resp e/u, and skin warm and dry.

## 2015-05-16 NOTE — ED Notes (Signed)
Called for pt in lobby, No response 

## 2015-05-16 NOTE — ED Notes (Signed)
Pt states he thinks he was bitten by a spider one week ago in his left posterior calf.  Nickel size scab noted on posterior calf with tenderness surrounding scab.  Do drainage noted, denies fever, chills but does state he has had a productive cough of clear sputum, nasal congestion and rhinorrhea.  Denies CP.  Bilat BS clear without wheezing or crackles

## 2015-05-16 NOTE — ED Provider Notes (Signed)
CSN: [id number]   Arrival date & time 05/16/15 2129  History  This chart was scribed for non-physician practitioner, Dierdre ForthHannah Azura Tufaro PA-C, working with Gwyneth SproutWhitney Plunkett, MD by Bethel BornBritney McCollum, ED Scribe. This patient was seen in room TR10C/TR10C and the patient's care was started at 9:54 PM.  Chief Complaint  Patient presents with  . Insect Bite    HPI The history is provided by the patient and medical records. No language interpreter was used.   Jared HaberSidney L Luff is a 69 y.o. male who presents to the Emergency Department complaining of a "bump" on the posterior left calf with onset 1 week ago. The area has grown in size. Associated symptoms include pain and itching at the site. The itching is worse when he takes a "hero tab", which is "something like Viagra". Soaking the leg in peroxide and applying rubbing alcohol has not improved the symptoms. No known history of diabetes. Pt also complains of chest congestion, chest tightness, and cough productive of clear sputum with onset this morning around 7:30 AM. He denies fever, chills, chest pain, SOB, nausea, and vomiting. No cardiac history. No recent long travel, Hx of DVT, leg swelling, recent surgery or fractures. Pt denies tobacco use.   Past Medical History  Diagnosis Date  . Hyperlipidemia   . Hypertension   . BPH (benign prostatic hypertrophy)   . ED (erectile dysfunction)     Past Surgical History  Procedure Laterality Date  . Tonsillectomy    . Colonoscopy N/A 12/21/2013    Procedure: COLONOSCOPY;  Surgeon: Rachael Feeaniel P Jacobs, MD;  Location: [facility];  Service: Endoscopy;  Laterality: N/A;    Family History  Problem Relation Age of Onset  . Heart disease Mother   . Heart disease Father     History  Substance Use Topics  . Smoking status: Never Smoker   . Smokeless tobacco: Never Used  . Alcohol Use: No     Review of Systems  Constitutional: Negative for fever, chills, diaphoresis, appetite change, fatigue and  unexpected weight change.  HENT: Positive for congestion. Negative for mouth sores.   Eyes: Negative for visual disturbance.  Respiratory: Positive for cough and chest tightness. Negative for shortness of breath and wheezing.   Cardiovascular: Negative for chest pain.  Gastrointestinal: Negative for nausea, vomiting, abdominal pain, diarrhea and constipation.  Endocrine: Negative for polydipsia, polyphagia and polyuria.  Genitourinary: Negative for dysuria, urgency, frequency and hematuria.  Musculoskeletal: Negative for back pain and neck stiffness.  Skin: Positive for wound. Negative for rash.       An ae of swelling, itching, and pain at the left posterior calf  Allergic/Immunologic: Negative for immunocompromised state.  Neurological: Negative for syncope, light-headedness and headaches.  Hematological: Does not bruise/bleed easily.  Psychiatric/Behavioral: Negative for sleep disturbance. The patient is not nervous/anxious.     Home Medications   Prior to Admission medications   Medication Sig Start Date End Date Taking? Authorizing Provider  amLODipine (NORVASC) 2.5 MG tablet Take 1 tablet (2.5 mg total) by mouth daily. 01/02/15   Roderick PeeJeffrey A Todd, MD  doxazosin (CARDURA) 8 MG tablet Take 1 tablet (8 mg total) by mouth at bedtime. 01/02/15   Roderick PeeJeffrey A Todd, MD  HYDROcodone-homatropine [facility](HYCODAN) 5-1.5 MG/5ML syrup Take 5 mLs by mouth every 6 (six) hours as needed for cough. 05/16/15   Edgardo Petrenko, PA-C  lisinopril-hydrochlorothiazide (PRINZIDE,ZESTORETIC) 20-25 MG per tablet 1 by mouth every morning 01/02/15   Roderick PeeJeffrey A Todd, MD  mupirocin ointment (BACTROBAN) 2 %  Place 1 application into the nose 2 (two) times daily. 05/16/15   Fairley Copher, PA-C  simvastatin (ZOCOR) 20 MG tablet TAKE 1 TABLET (20 MG TOTAL) BY MOUTH DAILY. 01/02/15   Roderick Pee, MD  vardenafil (LEVITRA) 20 MG tablet Take 1 tablet (20 mg total) by mouth daily as needed for erectile dysfunction. 12/12/13   Roderick Pee, MD    Allergies  Review of patient's allergies indicates no known allergies.  Triage Vitals: BP 137/90 mmHg  Pulse 66  Temp(Src) 97.9 F (36.6 C) (Oral)  Resp 18  Ht 5\' 10"  (1.778 m)  Wt 230 lb 8 oz (104.554 kg)  BMI 33.07 kg/m2  SpO2 97%  Physical Exam  Constitutional: He is oriented to person, place, and time. He appears well-developed and well-nourished. No distress.  HENT:  Head: Normocephalic and atraumatic.  Right Ear: Tympanic membrane, external ear and ear canal normal.  Left Ear: Tympanic membrane, external ear and ear canal normal.  Nose: Mucosal edema and rhinorrhea present. No epistaxis. Right sinus exhibits no maxillary sinus tenderness and no frontal sinus tenderness. Left sinus exhibits no maxillary sinus tenderness and no frontal sinus tenderness.  Mouth/Throat: Uvula is midline and mucous membranes are normal. Mucous membranes are not pale and not cyanotic. No oropharyngeal exudate, posterior oropharyngeal edema, posterior oropharyngeal erythema or tonsillar abscesses.  Eyes: Conjunctivae are normal. Pupils are equal, round, and reactive to light. No scleral icterus.  Neck: Normal range of motion and full passive range of motion without pain.  Cardiovascular: Normal rate, regular rhythm, normal heart sounds and intact distal pulses.   No murmur heard. Pulmonary/Chest: Effort normal and breath sounds normal. No stridor.  Equal chest rise  Fine expiratory wheeze on exam  Abdominal: Soft. Bowel sounds are normal. He exhibits no distension. There is no tenderness.  Musculoskeletal: Normal range of motion.  Lymphadenopathy:    He has no cervical adenopathy.  Neurological: He is alert and oriented to person, place, and time.  Skin: Skin is warm and dry. No rash noted. He is not diaphoretic. No erythema.  3X3 CM area of scabbing to the posterior left calf without induration, erythema, increased warmth, fluctuance, or purulent drainage  Psychiatric: He has a  normal mood and affect.  Nursing note and vitals reviewed.   ED Course  Procedures   DIAGNOSTIC STUDIES: Oxygen Saturation is 97% on RA, normal by my interpretation.    COORDINATION OF CARE: 9:58 PM Discussed treatment plan which includes CXR and a breathing treatment with pt at bedside and pt agreed to plan.  Labs Review- Labs Reviewed - No data to display  Imaging Review Dg Chest 2 View  05/16/2015   CLINICAL DATA:  69 year old male with cough  EXAM: CHEST  2 VIEW  COMPARISON:  None.  FINDINGS: The heart size and mediastinal contours are within normal limits. Both lungs are clear. The visualized skeletal structures are unremarkable.  IMPRESSION: No active cardiopulmonary disease.   Electronically Signed   By: Elgie Collard M.D.   On: 05/16/2015 22:42    EKG Interpretation None     MDM   Final diagnoses:  Cough  Leg skin lesion, left    Jared Bryant presents with multiple complaints.  Patient with lesion to the posterior left calf. This was potentially a partial initially but now has become a flat scabbed area. No erythema, induration or surrounding warmth to suggest secondary infection. Suspect delayed healing due to persistent peroxide and alcohol usage. Will discharge home  with Bactroban for this.  Patient also with complaints of viral URI symptoms including cough reduction of clear sputum beginning this morning. Patient without fever or chills, chest pain or shortness of breath. Chest x-ray without evidence of pneumonia or pneumothorax. Patient given albuterol here in the emergency department with resolution of his fine expiratory crackles. He is well appearing. Will give albuterol and Bactroban for discharge home. Patient is to follow-up with his primary care provider within 2 days.  BP 132/83 mmHg  Pulse 95  Temp(Src) 98.1 F (36.7 C) (Oral)  Resp 20  Ht  (1.778 m)  Wt 230 lb 8 oz (104.554 kg)  BMI 33.07 kg/m2  SpO2 99%  I personally performed the  services described in this documentation, which was scribed in my presence. The recorded information has been reviewed and is accurate.  The patient was discussed with and seen by Dr. Anitra Lauth who agrees with the treatment plan.   Dahlia Client Tehilla Coffel, PA-C 05/17/15 0126  Gwyneth Sprout, MD 05/17/15 2218

## 2015-05-28 ENCOUNTER — Encounter: Payer: Self-pay | Admitting: Adult Health

## 2015-05-28 ENCOUNTER — Ambulatory Visit (INDEPENDENT_AMBULATORY_CARE_PROVIDER_SITE_OTHER): Payer: Medicare Other | Admitting: Adult Health

## 2015-05-28 VITALS — BP 122/86 | HR 74 | Temp 98.6°F | Resp 18 | Ht 70.0 in | Wt 226.3 lb

## 2015-05-28 DIAGNOSIS — J069 Acute upper respiratory infection, unspecified: Secondary | ICD-10-CM | POA: Diagnosis not present

## 2015-05-28 MED ORDER — BENZONATATE 200 MG PO CAPS: 200.0000 mg | ORAL_CAPSULE | Freq: Three times a day (TID) | ORAL | Status: DC | PRN

## 2015-05-28 NOTE — Progress Notes (Signed)
Subjective:    Patient ID: Jared Bryant, male    DOB: 1946-07-24, 69 y.o.   MRN: [id number]  HPI  69 year old male who presents for ER follow up. He was seen in the ER on 05/16/2015 for "of a "bump" on the posterior left calf with onset 1 week ago. The area has grown in size. Associated symptoms include pain and itching at the site. The itching is worse when he takes a "hero tab", which is "something like Viagra". Soaking the leg in peroxide and applying rubbing alcohol has not improved the symptoms. No known history of diabetes. Pt also complains of chest congestion, chest tightness, and cough productive of clear sputum with onset this morning around 7:30 AM. He denies fever, chills, chest pain, SOB, nausea, and vomiting. No cardiac history. No recent long travel, Hx of DVT, leg swelling".   Today in the office he endorses feeling " much better." His cough has improved and his bug bite has healed well. He continued to use the hydrocodone cough syrup at night. He has a slight dry cough in the day, denies sputum production, fevers, sinus pain or pressure.   Review of Systems  Constitutional: Negative.  Negative for fever, chills and fatigue.  HENT: Negative for congestion, postnasal drip, rhinorrhea, sinus pressure and sneezing.   Respiratory: Positive for cough. Negative for shortness of breath.   Cardiovascular: Negative.   Genitourinary: Negative.   Neurological: Negative.   Psychiatric/Behavioral: Negative.    Past Medical History  Diagnosis Date  . Hyperlipidemia   . Hypertension   . BPH (benign prostatic hypertrophy)   . ED (erectile dysfunction)     History   Social History  . Marital Status: Married    Spouse Name: N/A  . Number of Children: N/A  . Years of Education: N/A   Occupational History  . retired    Social History Main Topics  . Smoking status: Never Smoker   . Smokeless tobacco: Never Used  . Alcohol Use: No  . Drug Use: No  . Sexual Activity: Not on  file   Other Topics Concern  . Not on file   Social History Narrative    Past Surgical History  Procedure Laterality Date  . Tonsillectomy    . Colonoscopy N/A 12/21/2013    Procedure: COLONOSCOPY;  Surgeon: Rachael Fee, MD;  Location: [facility];  Service: Endoscopy;  Laterality: N/A;    Family History  Problem Relation Age of Onset  . Heart disease Mother   . Heart disease Father     No Known Allergies  Current Outpatient Prescriptions on File Prior to Visit  Medication Sig Dispense Refill  . amLODipine (NORVASC) 2.5 MG tablet Take 1 tablet (2.5 mg total) by mouth daily. 90 tablet 3  . doxazosin (CARDURA) 8 MG tablet Take 1 tablet (8 mg total) by mouth at bedtime. 90 tablet 3  . HYDROcodone-homatropine (HYCODAN) 5-1.5 MG/5ML syrup Take 5 mLs by mouth every 6 (six) hours as needed for cough. 120 mL 0  . lisinopril-hydrochlorothiazide (PRINZIDE,ZESTORETIC) 20-25 MG per tablet 1 by mouth every morning 100 tablet 3  . mupirocin ointment (BACTROBAN) 2 % Place 1 application into the nose 2 (two) times daily. 22 g 0  . simvastatin (ZOCOR) 20 MG tablet TAKE 1 TABLET (20 MG TOTAL) BY MOUTH DAILY. 90 tablet 3  . vardenafil (LEVITRA) 20 MG tablet Take 1 tablet (20 mg total) by mouth daily as needed for erectile dysfunction. (Patient not taking: Reported  on 05/28/2015) 10 tablet 6   No current facility-administered medications on file prior to visit.    BP 122/86 mmHg  Pulse 74  Temp(Src) 98.6 F (37 C) (Oral)  Resp 18  Ht 5\' 10"  (1.778 m)  Wt 226 lb 4.8 oz (102.649 kg)  BMI 32.47 kg/m2  SpO2 95%       Objective:   Physical Exam  Constitutional: He is oriented to person, place, and time. He appears well-developed and well-nourished. No distress.  HENT:  Head: Normocephalic and atraumatic.  Right Ear: External ear normal.  Left Ear: External ear normal.  Nose: Nose normal.  Mouth/Throat: Oropharynx is clear and moist.  Cardiovascular: Normal rate, regular rhythm,  normal heart sounds and intact distal pulses.  Exam reveals no gallop and no friction rub.   No murmur heard. Pulmonary/Chest: Effort normal and breath sounds normal. No respiratory distress. He has no wheezes. He has no rales. He exhibits no tenderness.  Neurological: He is alert and oriented to person, place, and time.  Skin: Skin is warm and dry. No rash noted. He is not diaphoretic. No erythema. No pallor.  Psychiatric: He has a normal mood and affect. His behavior is normal. Judgment and thought content normal.  Nursing note and vitals reviewed.     Assessment & Plan:  1. Acute upper respiratory infection - benzonatate (TESSALON) 200 MG capsule; Take 1 capsule (200 mg total) by mouth 3 (three) times daily as needed for cough.  Dispense: 20 capsule; Refill: 0 - Follow up if no continued improvement in 2-3 days.  - Continue with hydrocodone cough syrup at nighttime.

## 2015-05-28 NOTE — Patient Instructions (Signed)
It was great meeting you today!  Please only use the hydrocodone cough syrup at night.   I have sent a prescription to the pharmacy for Tessalon Pearls. Please use these during the day.   Continue to stay well hydrated.   Follow up with any fever or no improvement in symptoms.

## 2015-11-08 ENCOUNTER — Other Ambulatory Visit: Payer: Self-pay | Admitting: Family Medicine

## 2015-11-12 ENCOUNTER — Other Ambulatory Visit: Payer: Self-pay | Admitting: Family Medicine

## 2016-02-03 ENCOUNTER — Other Ambulatory Visit: Payer: Self-pay | Admitting: Family Medicine

## 2016-03-26 ENCOUNTER — Other Ambulatory Visit: Payer: Self-pay | Admitting: Family Medicine

## 2016-05-18 ENCOUNTER — Telehealth: Payer: Self-pay | Admitting: Family Medicine

## 2016-05-18 NOTE — Telephone Encounter (Signed)
Patient needs a refill for medication Doxazosin.  He usually gets [facility] to refill it by taking the bottle to them, however this time they wouldn't refill the medication.  They stated they called and someone told them we knew nothing about him here.  He is presently out of medication, so he is wanting a call back about his medication.

## 2016-05-19 MED ORDER — DOXAZOSIN MESYLATE 8 MG PO TABS: 8.0000 mg | ORAL_TABLET | Freq: Every day | ORAL | Status: DC

## 2016-05-19 NOTE — Telephone Encounter (Signed)
Rx sent to pharmacy. Pt has follow up appt scheduled for 9/18.

## 2016-07-13 ENCOUNTER — Other Ambulatory Visit: Payer: Self-pay | Admitting: *Deleted

## 2016-07-13 MED ORDER — AMLODIPINE BESYLATE 2.5 MG PO TABS: 2.5000 mg | ORAL_TABLET | Freq: Every day | ORAL | 2 refills | Status: DC

## 2016-07-16 ENCOUNTER — Telehealth: Payer: Self-pay | Admitting: *Deleted

## 2016-07-16 NOTE — Telephone Encounter (Signed)
Please call pt to reschedule appointment that he missed with Dr. Tawanna Coolerodd.   PLEASE NOTE: All timestamps contained within this report are represented as [place] Standard Time. CONFIDENTIALTY NOTICE: This fax transmission is intended only for the addressee. It contains information that is legally privileged, confidential or otherwise protected from use or disclosure. If you are not the intended recipient, you are strictly prohibited from reviewing, disclosing, copying using or disseminating any of this information or taking any action in reliance on or regarding this information. If you have received this fax in error, please notify us immediately by telephone so that we can arrange for its return to us. Phone: [phone], Toll-Free: [phone], Fax: [phone] Page: 1 of 1 Call Id: [id number] [facility] Night - Client Nonclinical Telephone Record [facility] Client [facility] - Client Client Site [facility] - Night Physician Alonza Smokerodd, Jeff - MD Contact Type Call Who Is Calling Patient / Member / Family / Caregiver Caller Name Virgel ManifoldSidney Ousley Caller Phone Number [phone] Call Type Message Only Information Provided Reason for Call Returning a Call from the Office Initial Comment Caller said he missed calls from the office about an appt. He does not know anything about an appt. Additional Comment Call Closed By: Lyda PeroneLance Hooper Transaction Date/Time: 07/15/2016 5:48:16 PM (ET)

## 2016-07-16 NOTE — Telephone Encounter (Signed)
Pt aware of appt time and date and will be here.

## 2016-07-20 ENCOUNTER — Encounter: Payer: Self-pay | Admitting: Family Medicine

## 2016-07-20 ENCOUNTER — Ambulatory Visit (INDEPENDENT_AMBULATORY_CARE_PROVIDER_SITE_OTHER): Payer: Medicare Other | Admitting: Family Medicine

## 2016-07-20 VITALS — BP 136/84 | HR 67 | Temp 98.2°F | Wt 235.1 lb

## 2016-07-20 DIAGNOSIS — Z87898 Personal history of other specified conditions: Secondary | ICD-10-CM | POA: Diagnosis not present

## 2016-07-20 DIAGNOSIS — E785 Hyperlipidemia, unspecified: Secondary | ICD-10-CM

## 2016-07-20 DIAGNOSIS — Z23 Encounter for immunization: Secondary | ICD-10-CM | POA: Diagnosis not present

## 2016-07-20 DIAGNOSIS — I10 Essential (primary) hypertension: Secondary | ICD-10-CM

## 2016-07-20 MED ORDER — LISINOPRIL-HYDROCHLOROTHIAZIDE 20-25 MG PO TABS: 1.0000 | ORAL_TABLET | Freq: Every morning | ORAL | 3 refills | Status: DC

## 2016-07-20 MED ORDER — AMLODIPINE BESYLATE 2.5 MG PO TABS: 2.5000 mg | ORAL_TABLET | Freq: Every day | ORAL | 3 refills | Status: DC

## 2016-07-20 MED ORDER — BENZONATATE 200 MG PO CAPS: 200.0000 mg | ORAL_CAPSULE | Freq: Three times a day (TID) | ORAL | 3 refills | Status: DC | PRN

## 2016-07-20 MED ORDER — SIMVASTATIN 20 MG PO TABS: 20.0000 mg | ORAL_TABLET | Freq: Every day | ORAL | 3 refills | Status: DC

## 2016-07-20 MED ORDER — DOXAZOSIN MESYLATE 8 MG PO TABS: 8.0000 mg | ORAL_TABLET | Freq: Every day | ORAL | 3 refills | Status: DC

## 2016-07-20 NOTE — Progress Notes (Signed)
Mr. Jared Bryant is a 70 year old married male nonsmoker who comes in today for evaluation of 3 issues  He has history of hypertension Norvasc 2.5 mg, Cardura 8 mg, and lisinopril 20-25 daily. BP is 136/84  He also takes Zocor 20 mg daily for hyperlipidemia.  He's had difficulty last 6 weeks for swallowing food. He says especially steaks gets stuck. He's had a gastroenterology consult 2015 for a colonoscopy which is reported to be normal. He's also controlled with constipation decide despite taking milk of magnesia prune juice daily.  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  Vital signs stable is afebrile  Problem #1 hypertension at goal........ continue current therapy  Problem #2......[name]. Dysphagia......... 6 weeks.......... soft diet GI consult ASAP  #3 constipation......... GI evaluation as above.

## 2016-07-20 NOTE — Patient Instructions (Signed)
Stay on a soft liquidy diet,,,,,,,,,,,, no stakes no hot dogs nothing difficult to chew or swallow until you see the gastroenterologist. I suspect you have a blockage in your upper esophagus it's causing her symptoms for the past 6 weeks.  Continue blood pressure medication  Continue Zocor  Your last physical exam was March 2016. Set up a time in the next couple months to see somebody for general physical examination,,,,,,,,,,,, Kandee Keenory or Raynelle FanningJulie are 2 new nurse practitioners or Dr. [place]

## 2016-07-20 NOTE — Progress Notes (Signed)
Pre visit review using our clinic review tool, if applicable. No additional management support is needed unless otherwise documented below in the visit note. 

## 2016-07-29 ENCOUNTER — Ambulatory Visit (INDEPENDENT_AMBULATORY_CARE_PROVIDER_SITE_OTHER): Payer: Medicare Other | Admitting: Physician Assistant

## 2016-07-29 ENCOUNTER — Encounter: Payer: Self-pay | Admitting: Physician Assistant

## 2016-07-29 VITALS — BP 114/70 | HR 65 | Ht 70.0 in | Wt 227.0 lb

## 2016-07-29 DIAGNOSIS — K5901 Slow transit constipation: Secondary | ICD-10-CM

## 2016-07-29 DIAGNOSIS — R1310 Dysphagia, unspecified: Secondary | ICD-10-CM

## 2016-07-29 NOTE — Patient Instructions (Addendum)
You have been scheduled for an endoscopy. Please follow written instructions given to you at your visit today. If you use inhalers (even only as needed), please bring them with you on the day of your procedure. Your physician has requested that you go to www.startemmi.com and enter the access code given to you at your visit today. This web site gives a general overview about your procedure. However, you should still follow specific instructions given to you by our office regarding your preparation for the procedure.   Try Gas-X for bloating and gas before evening meal. Continue Milk of Magnesia and prune juice . Drink lots of water. Continue daily probiotic.

## 2016-07-29 NOTE — Progress Notes (Signed)
I agree with the above note, plan 

## 2016-07-29 NOTE — Progress Notes (Signed)
Subjective:    Patient ID: Jared Bryant, male    DOB: 05/22/1946, 70 y.o.   MRN: [id number]  HPI Elic is a pleasant 70 year old African-American male known to Dr. Christella Hartigan who is referred today by Dr. Tawanna Cooler for evaluation of new onset dysphagia.He has not had prior EGD.Jared Bryant Patient says his symptoms started about 2 months ago with a sensation of food "hanging up". He is experiencing this with almost every meal but is not regurgitating. He says usually he is able to keep eating he just feels as if the food takes a long time to get to his stomach and sometimes if he ate late in the evening he'll be awakened a couple of hours after eating with sour brash. He denies any odynophagia. He mentions that early in the mornings he will have some reflux and has on occasion brought up a little bit of bright red blood. He has not had any chronic problems with heartburn or indigestion. His appetite is been fine his weight has been stable. Other problem is chronic constipation. He has recently started taking milk of magnesia and prune juice on a daily basis as per Dr. Nelida Meuse instructions and says this generally works well. He will usually have a good bowel movement each morning but later in the day starts feeling bloated which she equates to constipation. He is also been taking a daily probiotic. Prior to starting the milk of magnesia and prune juice he was only having a bowel movement every 2-3 days but again is having a good bowel movement at least once daily currently. Last colonoscopy done in February 2015 with scattered diverticulosis throughout the colon and no polyps.  Review of Systems Pertinent positive and negative review of systems were noted in the above HPI section.  All other review of systems was otherwise negative.  Outpatient Encounter Prescriptions as of 07/29/2016  Medication Sig  . amLODipine (NORVASC) 2.5 MG tablet Take 1 tablet (2.5 mg total) by mouth daily.  . benzonatate (TESSALON) 200 MG  capsule Take 1 capsule (200 mg total) by mouth 3 (three) times daily as needed for cough.  . doxazosin (CARDURA) 8 MG tablet Take 1 tablet (8 mg total) by mouth at bedtime.  Jared Bryant lisinopril-hydrochlorothiazide (PRINZIDE,ZESTORETIC) 20-25 MG tablet Take 1 tablet by mouth every morning.  . Misc Natural Products (MENS PROSTATE HEALTH FORMULA PO) Take by mouth.  . simvastatin (ZOCOR) 20 MG tablet Take 1 tablet (20 mg total) by mouth daily.  . [DISCONTINUED] HYDROcodone-homatropine (HYCODAN) 5-1.5 MG/5ML syrup Take 5 mLs by mouth every 6 (six) hours as needed for cough. (Patient not taking: Reported on 07/20/2016)  . [DISCONTINUED] mupirocin ointment (BACTROBAN) 2 % Place 1 application into the nose 2 (two) times daily. (Patient not taking: Reported on 07/20/2016)  . [DISCONTINUED] vardenafil (LEVITRA) 20 MG tablet Take 1 tablet (20 mg total) by mouth daily as needed for erectile dysfunction. (Patient not taking: Reported on 07/20/2016)   No facility-administered encounter medications on file as of 07/29/2016.    No Known Allergies Patient Active Problem List   Diagnosis Date Noted  . History of dysphasia 07/20/2016  . Nonspecific abnormal finding in stool contents 12/21/2013  . Diverticulosis of colon (without mention of hemorrhage) 12/21/2013  . Abdominal bloating 12/12/2013  . Hyperlipidemia 07/27/2008  . ERECTILE DYSFUNCTION 07/27/2008  . Essential hypertension 07/27/2008  . BPH associated with nocturia 07/27/2008   Social History   Social History  . Marital status: Married    Spouse name: N/A  .  Number of children: N/A  . Years of education: N/A   Occupational History  . retired    Social History Main Topics  . Smoking status: Never Smoker  . Smokeless tobacco: Never Used  . Alcohol use No  . Drug use: No  . Sexual activity: Not on file   Other Topics Concern  . Not on file   Social History Narrative  . No narrative on file    Mr. Budzinski's family history includes Heart  disease in his father and mother.      Objective:    Vitals:   07/29/16 1452  BP: 114/70  Pulse: 65    Physical Exam  well-developed older African-American male in no acute distress, accompanied by his wife blood pressure 114/70 pulse 65 85 foot 10 weight 227 BMI 32.5. HEENT;nontraumatic normocephalic EOMI PERRLA sclera anicteric, Cardiovascular; regular rate and rhythm with S1-S2 no murmur or gallop, Pulmonary ;clear bilaterally abdomen large soft nontender nondistended bowel sounds are active there is no palpable mass or hepatosplenomegaly, Rectal; exam not done, Ext; no clubbing cyanosis or edema skin warm dry, Neuropsych; mood and affect appropriate       Assessment & Plan:   #381  70 year old AA male  with new onset of solid food dysphagia 2 months. Rule out esophageal stricture versus neoplasm #2  chronic constipation #3 diverticulosis #4 hypertension  Plan; patient will be scheduled for EGD with probable esophageal dilation with Dr. Christella HartiganJacobs. Procedure was discussed in detail with the patient including risks and benefits and he is agreeable to proceed We discussed management of his chronic constipation, for now he will continue milk of magnesia and prune juice on a daily basis and was advised to  titrate the milk of magnesia as needed . Liberal water intake Suggested adding Gas-X in the evening for bloating.  Amy S Esterwood PA-C 07/29/2016   Cc: Roderick Peeodd, Jeffrey A, MD

## 2016-08-21 ENCOUNTER — Other Ambulatory Visit: Payer: Self-pay | Admitting: Emergency Medicine

## 2016-08-21 ENCOUNTER — Telehealth: Payer: Self-pay | Admitting: Family Medicine

## 2016-08-21 NOTE — Telephone Encounter (Signed)
Pt need new Rx for doxazosin  Pharm:  [facility]    Pt state that the pharmacy would not refill b/c they did not have permission to do so.

## 2016-08-21 NOTE — Telephone Encounter (Signed)
Called and spoke with pharmacy. Prescription will be filled Monday per pharmacy assistant.

## 2016-08-28 ENCOUNTER — Ambulatory Visit (AMBULATORY_SURGERY_CENTER): Payer: Medicare Other | Admitting: Gastroenterology

## 2016-08-28 ENCOUNTER — Encounter: Payer: Self-pay | Admitting: Gastroenterology

## 2016-08-28 VITALS — BP 138/72 | HR 72 | Temp 98.0°F | Resp 18 | Ht 70.0 in | Wt 227.0 lb

## 2016-08-28 DIAGNOSIS — I10 Essential (primary) hypertension: Secondary | ICD-10-CM | POA: Diagnosis not present

## 2016-08-28 DIAGNOSIS — K449 Diaphragmatic hernia without obstruction or gangrene: Secondary | ICD-10-CM

## 2016-08-28 DIAGNOSIS — K209 Esophagitis, unspecified without bleeding: Secondary | ICD-10-CM

## 2016-08-28 DIAGNOSIS — R1319 Other dysphagia: Secondary | ICD-10-CM

## 2016-08-28 DIAGNOSIS — R1310 Dysphagia, unspecified: Secondary | ICD-10-CM

## 2016-08-28 MED ORDER — SODIUM CHLORIDE 0.9 % IV SOLN: 500.0000 mL | INTRAVENOUS | Status: DC

## 2016-08-28 MED ORDER — OMEPRAZOLE 40 MG PO CPDR: 40.0000 mg | DELAYED_RELEASE_CAPSULE | Freq: Every day | ORAL | 3 refills | Status: DC

## 2016-08-28 NOTE — Op Note (Signed)
[facility] Patient Name: Jared ManifoldSidney Bryant Procedure Date: 08/28/2016 9:46 AM MRN: [id number] Endoscopist: Rachael Feeaniel P Zelta Enfield , MD Age: 70 Referring MD:  Date of Birth: January 02, 1946 Gender: Male Account #: [id number] Procedure:                Upper GI endoscopy Indications:              Dysphagia Medicines:                Monitored Anesthesia Care Procedure:                Pre-Anesthesia Assessment:                           - Prior to the procedure, a History and Physical                            was performed, and patient medications and                            allergies were reviewed. The patient's tolerance of                            previous anesthesia was also reviewed. The risks                            and benefits of the procedure and the sedation                            options and risks were discussed with the patient.                            All questions were answered, and informed consent                            was obtained. Prior Anticoagulants: The patient has                            taken no previous anticoagulant or antiplatelet                            agents. ASA Grade Assessment: II - A patient with                            mild systemic disease. After reviewing the risks                            and benefits, the patient was deemed in                            satisfactory condition to undergo the procedure.                           After obtaining informed consent, the endoscope was  passed under direct vision. Throughout the                            procedure, the patient's blood pressure, pulse, and                            oxygen saturations were monitored continuously. The                            Model GIF-HQ190 [id number] scope was introduced                            through the mouth, and advanced to the second part                            of duodenum. The upper GI endoscopy was                          accomplished without difficulty. The patient                            tolerated the procedure well. Scope In: Scope Out: Findings:                 A small to medium-sized hiatal hernia was present.                           LA Grade A (one or more mucosal breaks less than 5                            mm, not extending between tops of 2 mucosal folds)                            esophagitis with no bleeding was found in the lower                            third of the esophagus.                           The exam was otherwise without abnormality. Complications:            No immediate complications. Estimated blood loss:                            None. Estimated Blood Loss:     Estimated blood loss: none. Impression:               - Small to medium-sized hiatal hernia.                           - LA Grade A esophagitis.                           - The examination was otherwise normal.                           -  No specimens collected. Recommendation:           - Patient has a contact number available for                            emergencies. The signs and symptoms of potential                            delayed complications were discussed with the                            patient. Return to normal activities tomorrow.                            Written discharge instructions were provided to the                            patient.                           - Resume previous diet.                           - Continue present medications.                           - Please also start the omeprazole that was called                            in today for your acid related esophageal damage.                            Take one pill 20-30 minutes prior to breakfast meal                            daily.                           - Please call to report on your response in 3-4                            weeks. Rachael Fee, MD 08/28/2016 10:01:55 AM This  report has been signed electronically.

## 2016-08-28 NOTE — Patient Instructions (Signed)
YOU HAD AN ENDOSCOPIC PROCEDURE TODAY AT THE [facility]:   Refer to the procedure report that was given to you for any specific questions about what was found during the examination.  If the procedure report does not answer your questions, please call your gastroenterologist to clarify.  If you requested that your care partner not be given the details of your procedure findings, then the procedure report has been included in a sealed envelope for you to review at your convenience later.  YOU SHOULD EXPECT: Some feelings of bloating in the abdomen. Passage of more gas than usual.  Walking can help get rid of the air that was put into your GI tract during the procedure and reduce the bloating.   Please Note:  You might notice some irritation and congestion in your nose or some drainage.  This is from the oxygen used during your procedure.  There is no need for concern and it should clear up in a day or so.  SYMPTOMS TO REPORT IMMEDIATELY:    Following upper endoscopy (EGD)  Vomiting of blood or coffee ground material  New chest pain or pain under the shoulder blades  Painful or persistently difficult swallowing  New shortness of breath  Fever of 100F or higher  Black, tarry-looking stools  For urgent or emergent issues, a gastroenterologist can be reached at any hour by calling [phone].   DIET:  We do recommend a small meal at first, but then you may proceed to your regular diet.  Drink plenty of fluids but you should avoid alcoholic beverages for 24 hours.  ACTIVITY:  You should plan to take it easy for the rest of today and you should NOT DRIVE or use heavy machinery until tomorrow (because of the sedation medicines used during the test).    FOLLOW UP: Our staff will call the number listed on your records the next business day following your procedure to check on you and address any questions or concerns that you may have regarding the information given to you  following your procedure. If we do not reach you, we will leave a message.  However, if you are feeling well and you are not experiencing any problems, there is no need to return our call.  We will assume that you have returned to your regular daily activities without incident.  If any biopsies were taken you will be contacted by phone or by letter within the next 1-3 weeks.  Please call us at [phone] if you have not heard about the biopsies in 3 weeks.    SIGNATURES/CONFIDENTIALITY: You and/or your care partner have signed paperwork which will be entered into your electronic medical record.  These signatures attest to the fact that that the information above on your After Visit Summary has been reviewed and is understood.  Full responsibility of the confidentiality of this discharge information lies with you and/or your care-partner.  Take your stomach medication every day 1/2 hour before breakfast.   Read all of the handouts given to you by your recovery room nurse.

## 2016-08-28 NOTE — Progress Notes (Signed)
Patient awakening,vss,report to rn 

## 2016-08-31 ENCOUNTER — Telehealth: Payer: Self-pay

## 2016-08-31 NOTE — Telephone Encounter (Signed)
  Follow up Call-  Call back number 08/28/2016  Post procedure Call Back phone  # [phone]  Permission to leave phone message Yes  Some recent data might be hidden     Patient questions:  Do you have a fever, pain , or abdominal swelling? No. Pain Score  0 *  Have you tolerated food without any problems? Yes.    Have you been able to return to your normal activities? Yes.    Do you have any questions about your discharge instructions: Diet   No. Medications  No. Follow up visit  No.  Do you have questions or concerns about your Care? No.  Actions: * If pain score is 4 or above: No action needed, pain <4.

## 2016-10-05 ENCOUNTER — Other Ambulatory Visit: Payer: Self-pay | Admitting: Family Medicine

## 2017-01-04 ENCOUNTER — Telehealth: Payer: Self-pay | Admitting: Family Medicine

## 2017-01-04 NOTE — Telephone Encounter (Signed)
Patient requesting a RX refill on mupirocin ointment 2 %   Contact Info: [phone]

## 2017-01-05 ENCOUNTER — Encounter: Payer: Self-pay | Admitting: Family Medicine

## 2017-01-05 ENCOUNTER — Ambulatory Visit (INDEPENDENT_AMBULATORY_CARE_PROVIDER_SITE_OTHER): Payer: Medicare Other | Admitting: Family Medicine

## 2017-01-05 VITALS — BP 112/70 | HR 83 | Temp 98.3°F | Ht 70.0 in | Wt 228.2 lb

## 2017-01-05 DIAGNOSIS — R21 Rash and other nonspecific skin eruption: Secondary | ICD-10-CM | POA: Diagnosis not present

## 2017-01-05 MED ORDER — TRIAMCINOLONE ACETONIDE 0.1 % EX CREA: 1.0000 "application " | TOPICAL_CREAM | Freq: Two times a day (BID) | CUTANEOUS | 0 refills | Status: DC

## 2017-01-05 MED ORDER — MUPIROCIN 2 % EX OINT: 1.0000 "application " | TOPICAL_OINTMENT | Freq: Two times a day (BID) | CUTANEOUS | 0 refills | Status: DC

## 2017-01-05 NOTE — Progress Notes (Signed)
HPI:  Acute visit for skin rash: -x1 week since starting softball back up, wears wool pants -Itchy area of skin on the posterior left calf -Reports all his primary doctor and Denyse Amass for this in the past, then was referred to a dermatologist -Reports the dermatologist did no testing of this area and recommended mupirocin ointment which helps the lesion significantly -However, it never completely went away and has recurred since starting softball back up -No other lesions elsewhere, fevers, malaise, using or pain   ROS: See pertinent positives and negatives per HPI.  Past Medical History:  Diagnosis Date  . Allergy   . BPH (benign prostatic hypertrophy)   . ED (erectile dysfunction)   . Hyperlipidemia   . Hypertension     Past Surgical History:  Procedure Laterality Date  . COLONOSCOPY N/A 12/21/2013   Procedure: COLONOSCOPY;  Surgeon: Rachael Fee, MD;  Location: [facility];  Service: Endoscopy;  Laterality: N/A;  . TONSILLECTOMY      Family History  Problem Relation Age of Onset  . Heart disease Mother   . Heart disease Father     Social History   Social History  . Marital status: Married    Spouse name: N/A  . Number of children: N/A  . Years of education: N/A   Occupational History  . retired    Social History Main Topics  . Smoking status: Never Smoker  . Smokeless tobacco: Never Used  . Alcohol use No  . Drug use: No  . Sexual activity: Not Asked   Other Topics Concern  . None   Social History Narrative  . None     Current Outpatient Prescriptions:  .  amLODipine (NORVASC) 2.5 MG tablet, Take 1 tablet (2.5 mg total) by mouth daily., Disp: 100 tablet, Rfl: 3 .  doxazosin (CARDURA) 8 MG tablet, Take 1 tablet (8 mg total) by mouth at bedtime., Disp: 90 tablet, Rfl: 3 .  lisinopril-hydrochlorothiazide (PRINZIDE,ZESTORETIC) 20-25 MG tablet, Take 1 tablet by mouth every morning., Disp: 100 tablet, Rfl: 3 .  Misc Natural Products (MENS PROSTATE  HEALTH FORMULA PO), Take by mouth., Disp: , Rfl:  .  omeprazole (PRILOSEC) 40 MG capsule, Take 1 capsule (40 mg total) by mouth daily before breakfast., Disp: 90 capsule, Rfl: 3 .  simvastatin (ZOCOR) 20 MG tablet, TAKE 1 TABLET BY MOUTH DAILY., Disp: 90 tablet, Rfl: 1 .  mupirocin ointment (BACTROBAN) 2 %, Place 1 application into the nose 2 (two) times daily., Disp: 22 g, Rfl: 0 .  triamcinolone cream (KENALOG) 0.1 %, Apply 1 application topically 2 (two) times daily., Disp: 30 g, Rfl: 0  Current Facility-Administered Medications:  .  0.9 %  sodium chloride infusion, 500 mL, Intravenous, Continuous, Rachael Fee, MD  EXAM:  Vitals:   01/05/17 1514  BP: 112/70  Pulse: 83  Temp: 98.3 F (36.8 C)    Body mass index is 32.74 kg/m.  GENERAL: vitals reviewed and listed above, alert, oriented, appears well hydrated and in no acute distress  HEENT: atraumatic, conjunttiva clear, no obvious abnormalities on inspection of external nose and ears  NECK: no obvious masses on inspection  SKIN: Lemon-sized patch of scaly hyperpigmented skin on the left posterior calf, no crusting or oozing, no pus, no induration or swelling  MS: moves all extremities without noticeable abnormality  PSYCH: pleasant and cooperative, no obvious depression or anxiety  ASSESSMENT AND PLAN:  Discussed the following assessment and plan:  Skin rash  -we discussed possible serious  and likely etiologies, workup and treatment, treatment risks and return precautions; this looks more eczematous or fungal to me - but he insists that the mupirocin really helped -after this discussion, Jared ParodySidney opted for steroid cream, antifungal, mupirocin if helps -Advised follow-up with PCP or dermatologist in 2-4 weeks if not completely resolved -of course, we advised Jared ParodySidney  to return or notify a doctor immediately if symptoms worsen or persist or new concerns arise.   Patient Instructions  Apply a small amount triamcinolone  cream and an antifungal cream such as Lotrimin twice daily for 2 weeks.  I also sent a refill on the ointment he requested which she may also use.  Follow-up with your dermatologist or your primary doctor in 2-4 weeks if this is not completely resolved.  WE NOW OFFER   [facility] FAST TRACK!!!  SAME DAY Appointments for ACUTE CARE  Such as: Sprains, Injuries, cuts, abrasions, rashes, muscle pain, joint pain, back pain Colds, flu, sore throats, headache, allergies, cough, fever  Ear pain, sinus and eye infections Abdominal pain, nausea, vomiting, diarrhea, upset stomach Animal/insect bites  3 Easy Ways to Schedule: Walk-In Scheduling Call in scheduling Mychart Sign-up: [email or web address]           Kriste BasqueKIM, Jared Brinson R., DO

## 2017-01-05 NOTE — Telephone Encounter (Signed)
I spoke to the pt and explained that the Bactroban was originally prescribed by Dahlia ClientHannah Muthersbaugh PA-C.  Was prescribed to use 1 application into the nose bid.  Pt states he has skin irritation on the back of his leg.   Pt thought he may have made a mistake and stated he would come by the office and bring the tube of medication that needed a refill.  He came carrying the Bactroban tub.  Printed out where the medication was prescribed by Muthersbaugh along with directions.  Pt then told me that original pre scriber made an error when she typed directions and he has been using this for his leg.  Went on to say that Dr. Tawanna Coolerodd has never prescribed this medication and he will need to be seen in the office to make sure refills are appropriate.  Originally prescribed 05/16/15.  Pt agreed to make an appt.  Walked him to the front desk.

## 2017-01-05 NOTE — Patient Instructions (Signed)
Apply a small amount triamcinolone cream and an antifungal cream such as Lotrimin twice daily for 2 weeks.  I also sent a refill on the ointment he requested which she may also use.  Follow-up with your dermatologist or your primary doctor in 2-4 weeks if this is not completely resolved.  WE NOW OFFER   [facility] FAST TRACK!!!  SAME DAY Appointments for ACUTE CARE  Such as: Sprains, Injuries, cuts, abrasions, rashes, muscle pain, joint pain, back pain Colds, flu, sore throats, headache, allergies, cough, fever  Ear pain, sinus and eye infections Abdominal pain, nausea, vomiting, diarrhea, upset stomach Animal/insect bites  3 Easy Ways to Schedule: Walk-In Scheduling Call in scheduling Mychart Sign-up: [email or web address]

## 2017-01-05 NOTE — Progress Notes (Signed)
Pre visit review using our clinic review tool, if applicable. No additional management support is needed unless otherwise documented below in the visit note. 

## 2017-01-20 ENCOUNTER — Telehealth: Payer: Self-pay | Admitting: Family Medicine

## 2017-01-20 NOTE — Telephone Encounter (Signed)
° ° ° ° °  Pt call to say his grandson spilled soda on a RX that Dr Tawanna Coolerodd had wrote for him  CIALIS  He said that Dr Tawanna Coolerodd wrote the RX a couple of months ago and told him he could refill when he need to.So when he decided to have it refill this happen . He would like the rx caleld in to the below pharmacy   [facility]  [address]

## 2017-01-22 NOTE — Telephone Encounter (Signed)
I do not see where this was prescribed.  Will check with another provider.

## 2017-01-22 NOTE — Telephone Encounter (Signed)
Call in Cialis 20 mg to use prn, #10 with no rf

## 2017-01-25 MED ORDER — TADALAFIL 5 MG PO TABS: 5.0000 mg | ORAL_TABLET | Freq: Every day | ORAL | 2 refills | Status: DC | PRN

## 2017-01-25 NOTE — Telephone Encounter (Signed)
Pt following up on this refill request. Pt is convinced Dr Tawanna Coolerodd wrote this rx.. Pt states it was on a paper rx and Coke was spilled on.  I do not see Dr Tawanna Coolerodd wrote this either.

## 2017-01-25 NOTE — Telephone Encounter (Signed)
Dr. Tawanna Coolerodd in the office today.  Not filled by Dr. Tawanna Coolerodd in the past.  Will ask permission before sending.

## 2017-01-25 NOTE — Telephone Encounter (Signed)
Sent to the pharmacy by e-scribe for 3 months.  [facility] should [facility] care with another provider.  See below from last ov note.  Please help the pt to make appt.  Thanks!!   "Your last physical exam was March 2016. Set up a time in the next couple months to see somebody for general physical examination,,,,,,,,,,,, Kandee Keenory or Raynelle FanningJulie are 2 new nurse practitioners or Dr. Swazil[place]Jordan"

## 2017-01-26 NOTE — Telephone Encounter (Signed)
° ° ° ° °  Pt is aware that he need to establish with another provider

## 2017-02-02 IMAGING — DX DG CHEST 2V
2 series · 2 of 2 positions shown · non-contrast
Comparison: None.

CLINICAL DATA: 69-year-old male with cough

EXAM:
CHEST  2 VIEW

[chest pa]
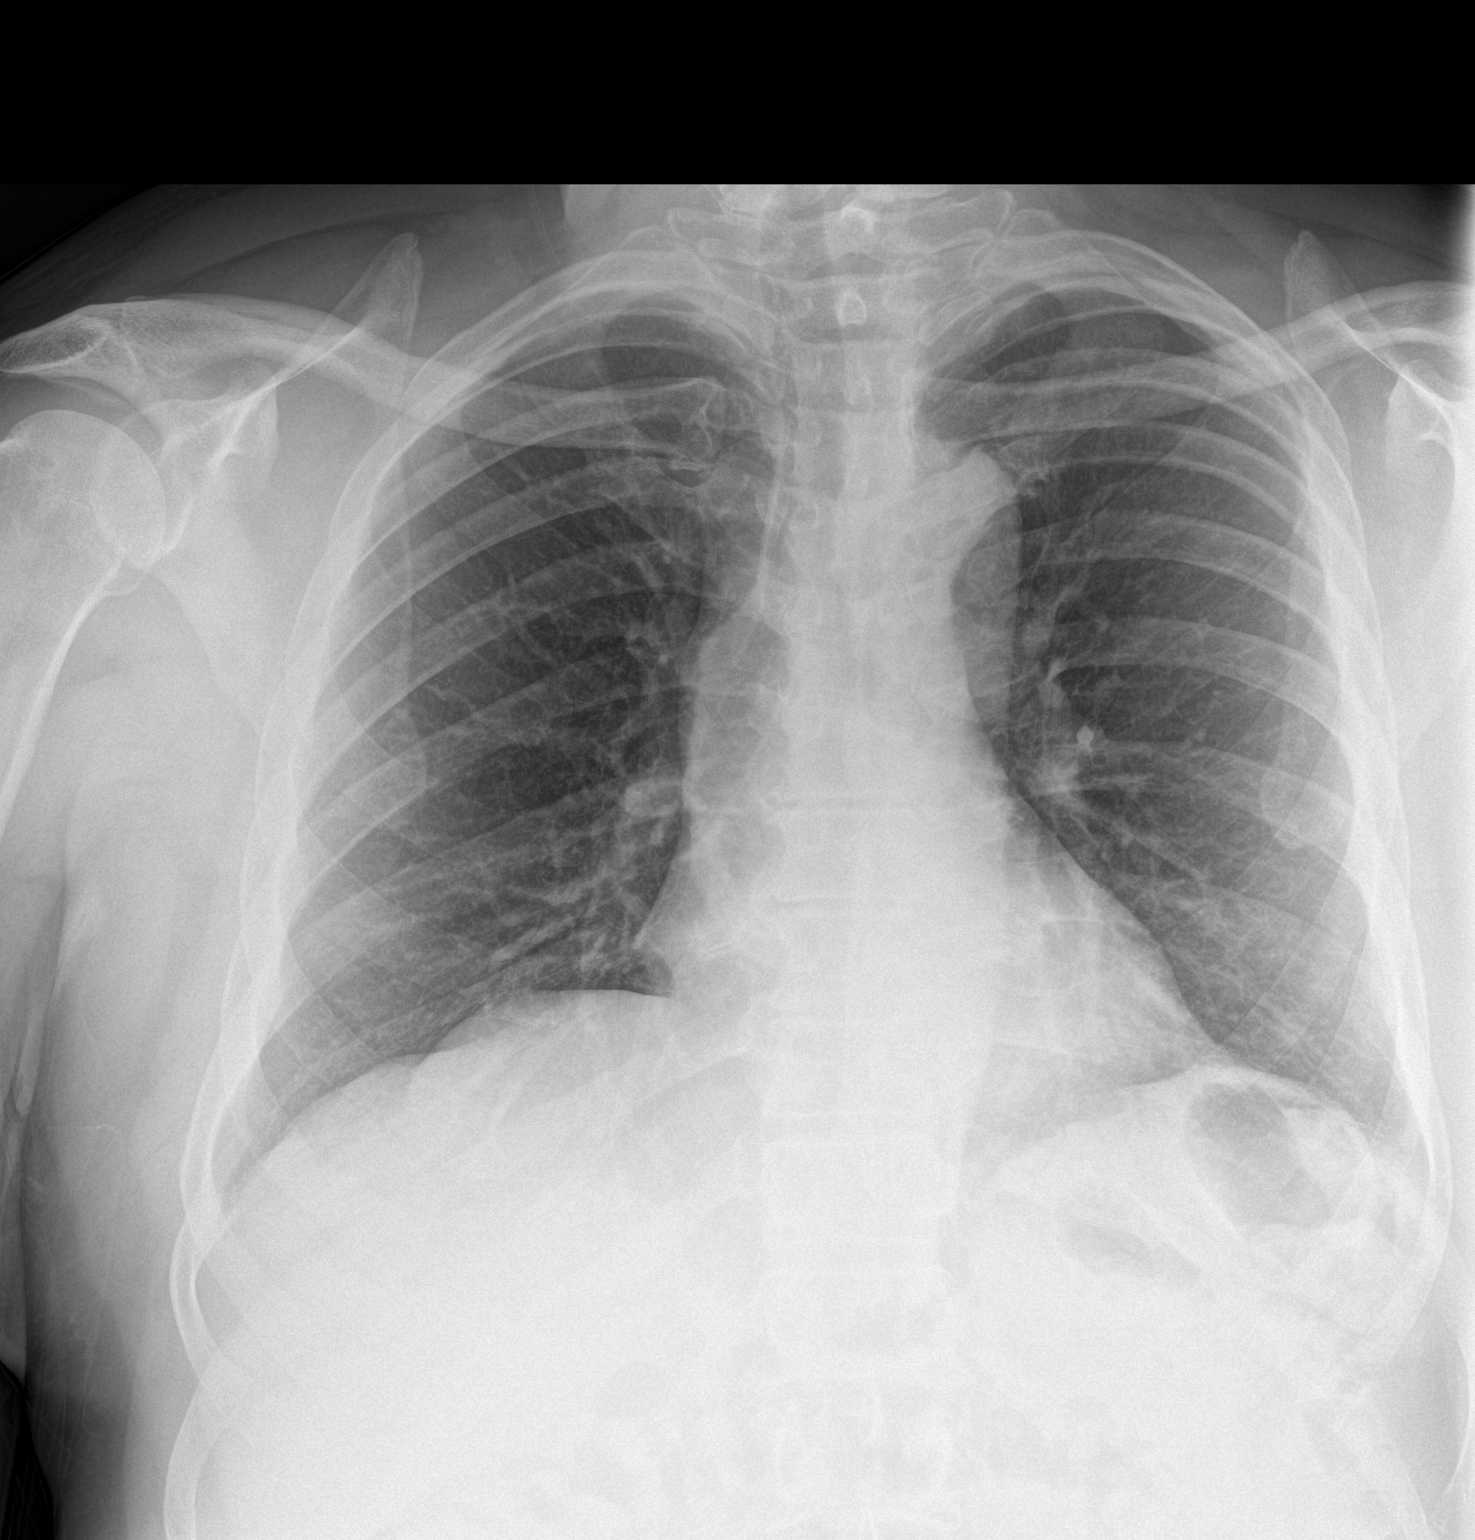

[chest lat]
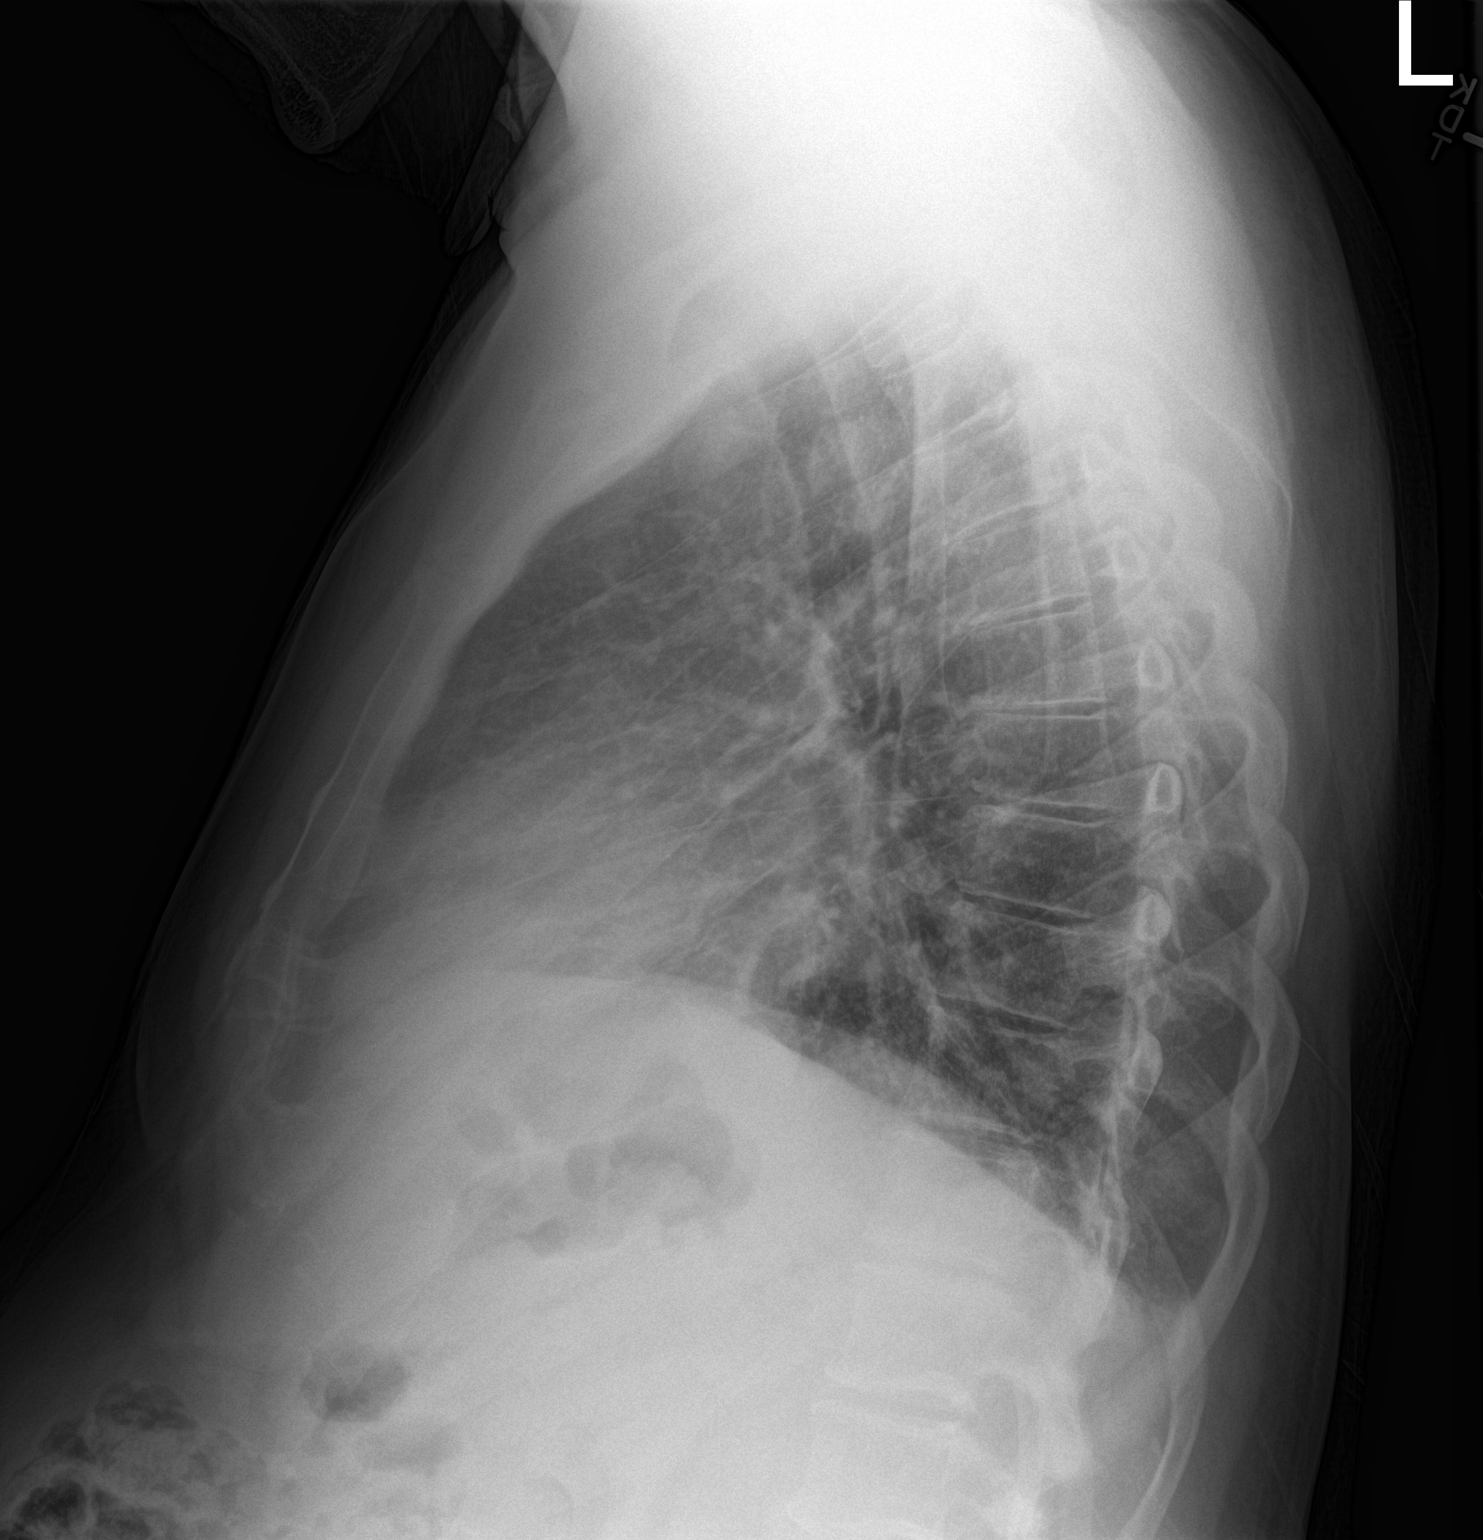

[2 of 2 positions shown; findings below may reference images not displayed]

FINDINGS: The heart size and mediastinal contours are within normal limits.
Both lungs are clear. The visualized skeletal structures are
unremarkable.
IMPRESSION: No active cardiopulmonary disease.

## 2017-05-13 ENCOUNTER — Telehealth: Payer: Self-pay | Admitting: Family Medicine

## 2017-05-13 MED ORDER — AMLODIPINE BESYLATE 2.5 MG PO TABS: 2.5000 mg | ORAL_TABLET | Freq: Every day | ORAL | 0 refills | Status: DC

## 2017-05-13 NOTE — Telephone Encounter (Signed)
Pt need new Rx for amlodipine  Pharm:  [facility]  Pt state that he is out of the Rx.

## 2017-05-13 NOTE — Telephone Encounter (Signed)
He can have 30 days. Needs to establish with a new provider

## 2017-05-13 NOTE — Telephone Encounter (Signed)
No lab work since 2016.  Was seen 07/2016.  Please advise.

## 2017-05-13 NOTE — Telephone Encounter (Signed)
#  30 [facility]  [facility]  Please help the pt to get on the schedule.  Thanks!!

## 2017-05-13 NOTE — Telephone Encounter (Signed)
Pt is aware of the Rx at the pharmacy and he needs he to establish with another provider he was in a staff meeting and will be calling back to schedule an appointment.

## 2017-05-18 ENCOUNTER — Other Ambulatory Visit: Payer: Self-pay | Admitting: Family Medicine

## 2017-05-18 NOTE — Telephone Encounter (Signed)
Called the pharmacy to see if refill was received on 05/13/17.  It was not.  Called in #30 while on the phone.  No further refills until seen in the office [facility].

## 2017-06-14 ENCOUNTER — Encounter (HOSPITAL_COMMUNITY): Payer: Self-pay | Admitting: Emergency Medicine

## 2017-06-14 ENCOUNTER — Ambulatory Visit (HOSPITAL_COMMUNITY)
Admission: EM | Admit: 2017-06-14 | Discharge: 2017-06-14 | Disposition: A | Discharge status: Home / Self Care | Payer: Medicare Other | Attending: Family Medicine | Admitting: Family Medicine

## 2017-06-14 DIAGNOSIS — S80812A Abrasion, left lower leg, initial encounter: Secondary | ICD-10-CM | POA: Diagnosis not present

## 2017-06-14 DIAGNOSIS — Z23 Encounter for immunization: Secondary | ICD-10-CM

## 2017-06-14 MED ORDER — TETANUS-DIPHTH-ACELL PERTUSSIS 5-2.5-18.5 LF-MCG/0.5 IM SUSP
0.5000 mL | Freq: Once | INTRAMUSCULAR | Status: AC
  Administered 2017-06-14: 0.5 mL via INTRAMUSCULAR

## 2017-06-14 MED ORDER — TETANUS-DIPHTH-ACELL PERTUSSIS 5-2.5-18.5 LF-MCG/0.5 IM SUSP
INTRAMUSCULAR | Status: AC
Start: 2017-06-14 — End: 2017-06-14
  Filled 2017-06-14: qty 0.5

## 2017-06-14 NOTE — ED Provider Notes (Signed)
  [facility]   [id number] 06/14/17 Arrival Time: 1315  ASSESSMENT & PLAN:  1. Abrasion of left lower extremity, initial encounter     Meds ordered this encounter  Medications  . Tdap (BOOSTRIX) injection 0.5 mL   Watch for any signs of infection. Wound care discussed. Reviewed expectations re: course of current medical issues. Questions answered. Outlined signs and symptoms indicating need for more acute intervention. Patient verbalized understanding. After Visit Summary given.   SUBJECTIVE:  Jared Bryant is a 71 y.o. male who presents with complaint of abrasion to his anterior lower left leg below the knee. Attempting to hook trailer to his truck when trailer slid off hitch and into his lower left leg. Minimal bleeding, now stopped. No significant pain. Ambulatory without problem. Reports Td out of date and requests booster.  ROS: As per HPI.   OBJECTIVE:  Vitals:   06/14/17 1425  BP: 129/85  Pulse: 69  Resp: (!) 22  Temp: 97.6 F (36.4 C)  TempSrc: Oral  SpO2: 97%     General appearance: alert; no distress Extremities: left lower leg with approximately 6 cm superficial abrasion anteriorly; no active bleeding; non-tender; no foreign body/debris  No Known Allergies  PMHx, SurgHx, SocialHx, Medications, and Allergies were reviewed in the Visit Navigator and updated as appropriate.      Mardella LaymanHagler, Kenneshia Rehm, MD 06/14/17 [phone]

## 2017-06-14 NOTE — ED Triage Notes (Signed)
Trailer fell off hitch and dragged down the shin of left leg.  Injury occurred today.  Abrasion to left lower leg.  Unsure when last tetanus was received

## 2017-07-02 ENCOUNTER — Other Ambulatory Visit: Payer: Self-pay | Admitting: Family Medicine

## 2017-07-06 ENCOUNTER — Other Ambulatory Visit: Payer: Self-pay | Admitting: Family Medicine

## 2017-08-19 ENCOUNTER — Other Ambulatory Visit: Payer: Self-pay | Admitting: Family Medicine

## 2017-08-24 ENCOUNTER — Other Ambulatory Visit: Payer: Self-pay

## 2017-08-24 MED ORDER — LISINOPRIL-HYDROCHLOROTHIAZIDE 20-25 MG PO TABS: 1.0000 | ORAL_TABLET | Freq: Every morning | ORAL | 0 refills | Status: DC

## 2017-08-26 NOTE — Telephone Encounter (Signed)
[facility].

## 2017-09-07 ENCOUNTER — Ambulatory Visit (INDEPENDENT_AMBULATORY_CARE_PROVIDER_SITE_OTHER): Payer: Medicare Other | Admitting: Adult Health

## 2017-09-07 ENCOUNTER — Encounter: Payer: Self-pay | Admitting: Adult Health

## 2017-09-07 VITALS — BP 116/84 | Temp 97.7°F | Ht 70.0 in | Wt 231.0 lb

## 2017-09-07 DIAGNOSIS — N401 Enlarged prostate with lower urinary tract symptoms: Secondary | ICD-10-CM

## 2017-09-07 DIAGNOSIS — Z23 Encounter for immunization: Secondary | ICD-10-CM

## 2017-09-07 DIAGNOSIS — Z0000 Encounter for general adult medical examination without abnormal findings: Secondary | ICD-10-CM | POA: Diagnosis not present

## 2017-09-07 DIAGNOSIS — I10 Essential (primary) hypertension: Secondary | ICD-10-CM | POA: Diagnosis not present

## 2017-09-07 DIAGNOSIS — Z760 Encounter for issue of repeat prescription: Secondary | ICD-10-CM | POA: Diagnosis not present

## 2017-09-07 DIAGNOSIS — R351 Nocturia: Secondary | ICD-10-CM

## 2017-09-07 MED ORDER — LISINOPRIL-HYDROCHLOROTHIAZIDE 20-25 MG PO TABS: 1.0000 | ORAL_TABLET | Freq: Every morning | ORAL | 0 refills | Status: DC

## 2017-09-07 MED ORDER — FLUTICASONE PROPIONATE 50 MCG/ACT NA SUSP: 2.0000 | Freq: Every day | NASAL | 6 refills | Status: AC

## 2017-09-07 MED ORDER — DOXAZOSIN MESYLATE 8 MG PO TABS: 8.0000 mg | ORAL_TABLET | Freq: Every day | ORAL | 0 refills | Status: DC

## 2017-09-07 MED ORDER — BENZONATATE 200 MG PO CAPS: 200.0000 mg | ORAL_CAPSULE | Freq: Two times a day (BID) | ORAL | 0 refills | Status: DC | PRN

## 2017-09-07 NOTE — Patient Instructions (Signed)
It was great seeing you today   I have sent in refills for your medications   Please follow up with me for your physical

## 2017-09-07 NOTE — Progress Notes (Signed)
Patient presents to clinic today to establish care. He is a pleasant 71 year old male who  has a past medical history of Allergy, BPH (benign prostatic hypertrophy), ED (erectile dysfunction), Hyperlipidemia, and Hypertension.   He is transferring care from Dr. Tawanna Coolerodd  His last physical was in 2016    Acute Concerns: Establish Care   Chronic Issues: BPH - He takes Cardura - needs a refill   Hypertension - He takes Norvasc 2.5 mg  and Prinzide 20-25 mg   Hyperlipidemia - Takes Zocor 20 mg daily.   Health Maintenance: Dental -- Routine Care  Vision -- Does not go  Immunizations -- Needs flu shot  Colonoscopy -- 2015  Diet: He does not eat red meat. He does eat out 1-2 times a week. Tries to drink about 20 bottles of water a week.  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    Past Medical History:  Diagnosis Date  . Allergy   . BPH (benign prostatic hypertrophy)   . ED (erectile dysfunction)   . Hyperlipidemia   . Hypertension     Past Surgical History:  Procedure Laterality Date  . TONSILLECTOMY      Current Outpatient Medications on File Prior to Visit  Medication Sig Dispense Refill  . amLODipine (NORVASC) 2.5 MG tablet TAKE ONE TABLET BY MOUTH ONE TIME DAILY  100 tablet 4  . lisinopril-hydrochlorothiazide (PRINZIDE,ZESTORETIC) 20-25 MG tablet Take 1 tablet by mouth every morning. 15 tablet 0  . omeprazole (PRILOSEC) 40 MG capsule Take 1 capsule (40 mg total) by mouth daily before breakfast. 90 capsule 3  . simvastatin (ZOCOR) 20 MG tablet TAKE 1 TABLET BY MOUTH DAILY. 90 tablet 1  . doxazosin (CARDURA) 8 MG tablet Take 1 tablet (8 mg total) by mouth at bedtime. (Patient not taking: Reported on 09/07/2017) 90 tablet 3   Current Facility-Administered Medications on File Prior to Visit  Medication Dose Route Frequency Provider Last Rate Last Dose  . 0.9 %  sodium chloride infusion  500 mL Intravenous Continuous Rachael FeeJacobs, Daniel P, MD        No Known  Allergies  Family History  Problem Relation Age of Onset  . Heart disease Mother   . Heart disease Father     Social History   Socioeconomic History  . Marital status: Married    Spouse name: Not on file  . Number of children: Not on file  . Years of education: Not on file  . Highest education level: Not on file  Social Needs  . Financial resource strain: Not on file  . Food insecurity - worry: Not on file  . Food insecurity - inability: Not on file  . Transportation needs - medical: Not on file  . Transportation needs - non-medical: Not on file  Occupational History  . Occupation: retired  Tobacco Use  . Smoking status: Never Smoker  . Smokeless tobacco: Never Used  Substance and Sexual Activity  . Alcohol use: No  . Drug use: No  . Sexual activity: Not on file  Other Topics Concern  . Not on file  Social History Narrative  . Not on file    Review of Systems  Constitutional: Negative.   Respiratory: Negative.   Cardiovascular: Negative.   Genitourinary: Negative.   Musculoskeletal: Negative.   Psychiatric/Behavioral: Negative.     BP 116/84 (BP Location: Left Arm)   Temp 97.7 F (36.5 C) (Oral)   Ht 5\' 10"  (1.778 m)   Wt 231  lb (104.8 kg)   BMI 33.15 kg/m   Physical Exam  Constitutional: He is oriented to person, place, and time and well-developed, well-nourished, and in no distress. No distress.  Obese   Cardiovascular: Normal rate, regular rhythm, normal heart sounds and intact distal pulses. Exam reveals no friction rub.  No murmur heard. Pulmonary/Chest: Effort normal and breath sounds normal. No respiratory distress. He has no wheezes. He has no rales.  Musculoskeletal: Normal range of motion. He exhibits no edema, tenderness or deformity.  Neurological: He is alert and oriented to person, place, and time. Gait normal. GCS score is 15.  Skin: Skin is warm and dry. No rash noted. He is not diaphoretic. No erythema. No pallor.  Psychiatric: Mood,  memory, affect and judgment normal.  Nursing note and vitals reviewed.  Assessment/Plan: 1. Encounter for medical examination to establish care - Needs to follow up for CPE  - Needs to work on weight loss through diet and exercise   2. Essential hypertension - Well controlled.  - Refilled Lisinopril/HCTZ   3. Medication refill - Tessalon Pearls  - Flonase   4. BPH associated with nocturia - Refilled Cardura   5. Need for influenza vaccination  - Flu vaccine HIGH DOSE PF (Fluzone High dose)    Shirline Freesory Mechell Girgis, NP

## 2017-09-15 ENCOUNTER — Ambulatory Visit (INDEPENDENT_AMBULATORY_CARE_PROVIDER_SITE_OTHER): Payer: Medicare Other | Admitting: Adult Health

## 2017-09-15 ENCOUNTER — Encounter: Payer: Self-pay | Admitting: Adult Health

## 2017-09-15 VITALS — BP 120/84 | Temp 98.0°F | Ht 70.0 in | Wt 229.0 lb

## 2017-09-15 DIAGNOSIS — R351 Nocturia: Secondary | ICD-10-CM

## 2017-09-15 DIAGNOSIS — N401 Enlarged prostate with lower urinary tract symptoms: Secondary | ICD-10-CM | POA: Diagnosis not present

## 2017-09-15 DIAGNOSIS — Z1159 Encounter for screening for other viral diseases: Secondary | ICD-10-CM

## 2017-09-15 DIAGNOSIS — I10 Essential (primary) hypertension: Secondary | ICD-10-CM

## 2017-09-15 DIAGNOSIS — E669 Obesity, unspecified: Secondary | ICD-10-CM | POA: Diagnosis not present

## 2017-09-15 DIAGNOSIS — E785 Hyperlipidemia, unspecified: Secondary | ICD-10-CM | POA: Diagnosis not present

## 2017-09-15 LAB — CBC WITH DIFFERENTIAL/PLATELET
BASOS ABS: 0.1 10*3/uL (ref 0.0–0.1)
BASOS PCT: 0.9 % (ref 0.0–3.0)
EOS PCT: 2 % (ref 0.0–5.0)
Eosinophils Absolute: 0.1 10*3/uL (ref 0.0–0.7)
HEMATOCRIT: 44.8 % (ref 39.0–52.0)
Hemoglobin: 15 g/dL (ref 13.0–17.0)
LYMPHS ABS: 1.4 10*3/uL (ref 0.7–4.0)
LYMPHS PCT: 22.4 % (ref 12.0–46.0)
MCHC: 33.5 g/dL (ref 30.0–36.0)
MCV: 91.6 fl (ref 78.0–100.0)
MONOS PCT: 9.9 % (ref 3.0–12.0)
Monocytes Absolute: 0.6 10*3/uL (ref 0.1–1.0)
NEUTROS ABS: 4 10*3/uL (ref 1.4–7.7)
NEUTROS PCT: 64.8 % (ref 43.0–77.0)
PLATELETS: 215 10*3/uL (ref 150.0–400.0)
RBC: 4.89 Mil/uL (ref 4.22–5.81)
RDW: 13.9 % (ref 11.5–15.5)
WBC: 6.1 10*3/uL (ref 4.0–10.5)

## 2017-09-15 LAB — BASIC METABOLIC PANEL
BUN: 14 mg/dL (ref 6–23)
CALCIUM: 9.5 mg/dL (ref 8.4–10.5)
CHLORIDE: 104 meq/L (ref 96–112)
CO2: 28 meq/L (ref 19–32)
Creatinine, Ser: 1.33 mg/dL (ref 0.40–1.50)
GFR: 68.4 mL/min (ref >60.00)
GLUCOSE: 108 mg/dL — AB (ref 70–99)
POTASSIUM: 3.6 meq/L (ref 3.5–5.1)
SODIUM: 141 meq/L (ref 135–145)

## 2017-09-15 LAB — LIPID PANEL
CHOL/HDL RATIO: 5
Cholesterol: 134 mg/dL (ref 0–200)
HDL: 28.7 mg/dL — ABNORMAL LOW (ref >39.00)
LDL Cholesterol: 87 mg/dL (ref 0–99)
NONHDL: 105.74
Triglycerides: 92 mg/dL (ref 0.0–149.0)
VLDL: 18.4 mg/dL (ref 0.0–40.0)

## 2017-09-15 LAB — HEPATIC FUNCTION PANEL
ALBUMIN: 4 g/dL (ref 3.5–5.2)
ALK PHOS: 39 U/L (ref 39–117)
ALT: 32 U/L (ref 0–53)
AST: 23 U/L (ref 0–37)
Bilirubin, Direct: 0.1 mg/dL (ref 0.0–0.3)
TOTAL PROTEIN: 7 g/dL (ref 6.0–8.3)
Total Bilirubin: 0.6 mg/dL (ref 0.2–1.2)

## 2017-09-15 LAB — PSA: PSA: 3.49 ng/mL (ref 0.10–4.00)

## 2017-09-15 LAB — TSH: TSH: 0.99 u[IU]/mL (ref 0.35–4.50)

## 2017-09-15 NOTE — Patient Instructions (Signed)
Look at get shingles vaccination at a local pharmacy   Please work on weight loss   I will follow up with you regarding your blood work

## 2017-09-15 NOTE — Progress Notes (Signed)
Subjective:    Patient ID: Jared Bryant, male    DOB: Jul 25, 1946, 71 y.o.   MRN: [id number]  HPI  Patient presents for yearly follow up exam. He is a pleasant 71 year old male who  has a past medical history of Allergy, BPH (benign prostatic hypertrophy), ED (erectile dysfunction), Hyperlipidemia, and Hypertension.  He takes Zocor 20 mg for hx of hyperlipideima   To manage his blood pressure he takes Lisinopril/HCTZ and Norvasc   BPH is managed well with Cardura   He takes Omeprazole for GERD   All immunizations and health maintenance protocols were reviewed with the patient and needed orders were placed. He is up to date on his vaccinations   Appropriate screening laboratory values were ordered for the patient including screening of hyperlipidemia, renal function and hepatic function. If indicated by BPH, a PSA was ordered.  Medication reconciliation,  past medical history, social history, problem list and allergies were reviewed in detail with the patient  Goals were established with regard to weight loss, exercise, and  diet in compliance with medications Diet: He does not eat red meat. He does eat out 1-2 times a week. Tries to drink about 20 bottles of water a week.  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  End of life planning was discussed. He does not have an advanced directive or living will. He would like information on this  His next colonoscopy is due in 2025. He particiaptes in routine dental exams but does not see an eye doctor   Review of Systems  Constitutional: Negative.   HENT: Negative.   Eyes: Negative.   Respiratory: Negative.   Cardiovascular: Negative.   Gastrointestinal: Negative.   Endocrine: Negative.   Genitourinary: Negative.   Musculoskeletal: Negative.   Skin: Negative.   Allergic/Immunologic: Negative.   Neurological: Negative.   Hematological: Negative.   Psychiatric/Behavioral: Negative.   All other systems  reviewed and are negative.  Past Medical History:  Diagnosis Date  . Allergy   . BPH (benign prostatic hypertrophy)   . ED (erectile dysfunction)   . Hyperlipidemia   . Hypertension     Social History   Socioeconomic History  . Marital status: Married    Spouse name: Not on file  . Number of children: Not on file  . Years of education: Not on file  . Highest education level: Not on file  Social Needs  . Financial resource strain: Not on file  . Food insecurity - worry: Not on file  . Food insecurity - inability: Not on file  . Transportation needs - medical: Not on file  . Transportation needs - non-medical: Not on file  Occupational History  . Occupation: retired  Tobacco Use  . Smoking status: Never Smoker  . Smokeless tobacco: Never Used  Substance and Sexual Activity  . Alcohol use: No  . Drug use: No  . Sexual activity: Not on file  Other Topics Concern  . Not on file  Social History Narrative   Married    Two children - both live in [place]    Two Step Children     Past Surgical History:  Procedure Laterality Date  . TONSILLECTOMY      Family History  Problem Relation Age of Onset  . Heart disease Mother   . Heart disease Father     No Known Allergies  Current Outpatient Medications on File Prior to Visit  Medication Sig Dispense Refill  . amLODipine (NORVASC) 2.5 MG  tablet TAKE ONE TABLET BY MOUTH ONE TIME DAILY  100 tablet 4  . benzonatate (TESSALON) 200 MG capsule Take 1 capsule (200 mg total) 2 (two) times daily as needed by mouth for cough. 20 capsule 0  . doxazosin (CARDURA) 8 MG tablet Take 1 tablet (8 mg total) at bedtime by mouth. 90 tablet 0  . fluticasone (FLONASE) 50 MCG/ACT nasal spray Place 2 sprays daily into both nostrils. 16 g 6  . lisinopril-hydrochlorothiazide (PRINZIDE,ZESTORETIC) 20-25 MG tablet Take 1 tablet every morning by mouth. 90 tablet 0  . omeprazole (PRILOSEC) 40 MG capsule Take 1 capsule (40 mg total) by mouth daily  before breakfast. 90 capsule 3  . simvastatin (ZOCOR) 20 MG tablet TAKE 1 TABLET BY MOUTH DAILY. 90 tablet 1   No current facility-administered medications on file prior to visit.     BP 120/84 (BP Location: Left Arm)   Temp 98 F (36.7 C) (Oral)   Ht 5\' 10"  (1.778 m)   Wt 229 lb (103.9 kg)   BMI 32.86 kg/m       Objective:   Physical Exam  Constitutional: He is oriented to person, place, and time. He appears well-developed and well-nourished. No distress.  Obese    HENT:  Head: Normocephalic and atraumatic.  Right Ear: External ear normal.  Left Ear: External ear normal.  Nose: Nose normal.  Mouth/Throat: Oropharynx is clear and moist. No oropharyngeal exudate.  Eyes: Conjunctivae and EOM are normal. Pupils are equal, round, and reactive to light. Right eye exhibits no discharge. Left eye exhibits no discharge.  Neck: Normal range of motion. Neck supple. No JVD present. No tracheal deviation present. No thyromegaly present.  Cardiovascular: Normal rate, regular rhythm, normal heart sounds and intact distal pulses. Exam reveals no gallop and no friction rub.  No murmur heard. Pulmonary/Chest: Effort normal and breath sounds normal. No stridor. No respiratory distress. He has no wheezes. He has no rales. He exhibits no tenderness.  Abdominal: Soft. Bowel sounds are normal. He exhibits no distension and no mass. There is no tenderness. There is no rebound and no guarding.  Musculoskeletal: Normal range of motion.  Lymphadenopathy:    He has no cervical adenopathy.  Neurological: He is alert and oriented to person, place, and time. He has normal reflexes. He displays normal reflexes. No cranial nerve deficit. He exhibits normal muscle tone. Coordination normal.  Skin: Skin is warm and dry. No rash noted. He is not diaphoretic. No erythema. No pallor.  Psychiatric: He has a normal mood and affect. His behavior is normal. Judgment and thought content normal.  Nursing note and vitals  reviewed.     Assessment & Plan:  1. Essential hypertension - Well controlled on current medication. No change in therapy  - work on weight loss  - Basic metabolic panel - CBC with Differential/Platelet - Hepatic function panel - Lipid panel - PSA - TSH - Hep C Antibody  2. BPH associated with nocturia - Continue with Cardura  - Basic metabolic panel - CBC with Differential/Platelet - Hepatic function panel - Lipid panel - PSA - TSH  3. Hyperlipidemia, unspecified hyperlipidemia type - Consider increase in Statin  - Basic metabolic panel - CBC with Differential/Platelet - Hepatic function panel - Lipid panel - PSA - TSH  4. Obesity (BMI 30.0-34.9) - educated on the importance of weight loss through diet and exercise  - Basic metabolic panel - CBC with Differential/Platelet - Hepatic function panel - Lipid panel - PSA -  TSH  5. Need for hepatitis C screening test  - Hep C Antibody  Shirline Freesory Lilton Pare, NP

## 2017-09-16 LAB — HEPATITIS C ANTIBODY
Hepatitis C Ab: NONREACTIVE
SIGNAL TO CUT-OFF: 0.03 (ref <1.00)

## 2017-10-11 ENCOUNTER — Other Ambulatory Visit: Payer: Self-pay | Admitting: Family Medicine

## 2017-12-14 ENCOUNTER — Other Ambulatory Visit: Payer: Self-pay | Admitting: Adult Health

## 2017-12-15 NOTE — Telephone Encounter (Signed)
Sent to the pharmacy by e-scribe. 

## 2018-01-11 ENCOUNTER — Telehealth: Payer: Self-pay

## 2018-01-14 NOTE — Telephone Encounter (Signed)
error 

## 2018-03-24 ENCOUNTER — Other Ambulatory Visit: Payer: Self-pay | Admitting: Adult Health

## 2018-03-25 NOTE — Telephone Encounter (Signed)
Sent to the pharmacy by e-scribe. 

## 2018-08-17 ENCOUNTER — Other Ambulatory Visit: Payer: Self-pay | Admitting: Adult Health

## 2018-08-19 ENCOUNTER — Other Ambulatory Visit: Payer: Self-pay | Admitting: Adult Health

## 2018-08-19 NOTE — Telephone Encounter (Signed)
Requested medication (s) are due for refill today:  yes  Requested medication (s) are on the active medication list:  yes  Future visit scheduled:  No; attempted to call pt.; left voice message for pt. to call and schedule a f/u appt.  Last Refill: 12/15/17; #90; RF x 2  Last office visit:  09/15/17  Requested Prescriptions  Pending Prescriptions Disp Refills   doxazosin (CARDURA) 8 MG tablet 90 tablet 2     Cardiovascular:  Alpha Blockers Failed - 08/19/2018  1:49 PM      Failed - Valid encounter within last 6 months    Recent Outpatient Visits          11 months ago Essential hypertension   Nature conservation officer at [employer], [place], NP   11 months ago Essential hypertension   Nature conservation officer at [employer] [facility], [place], NP   1 year ago Skin rash   Nature conservation officer at [employer], [place] [employer], DO   2 years ago History of dysphasia   Nature conservation officer at [employer], [place], MD   3 years ago Acute upper respiratory infection   Nature conservation officer at [employer] [facility], [place], NP             Passed - Last BP in normal range    BP Readings from Last 1 Encounters:  09/15/17 120/84

## 2018-08-19 NOTE — Telephone Encounter (Signed)
Copied from CRM [id number]. Topic: Quick Communication - Rx Refill/Question >> Aug 19, 2018  1:46 PM Jens Som A wrote: Medication: doxazosin (CARDURA) 8 MG tablet [[id number]]   Has the patient contacted their pharmacy? Yes  (Agent: If no, request that the patient contact the pharmacy for the refill.) (Agent: If yes, when and what did the pharmacy advise?)  Preferred Pharmacy (with phone number or street name): [facility] [id number], [place] - [address] [place] [address] Phone: [phone] Fax: [phone]    Agent: Please be advised that RX refills may take up to 3 business days. We ask that you follow-up with your pharmacy.

## 2018-08-22 ENCOUNTER — Other Ambulatory Visit: Payer: Self-pay | Admitting: Adult Health

## 2018-08-22 NOTE — Telephone Encounter (Signed)
Copied from CRM [id number]. Topic: Quick Communication - Rx Refill/Question >> Aug 22, 2018 10:32 AM Jay Schlichter wrote: Medication: amLODipine (NORVASC) 2.5 MG tablet Has the patient contacted their pharmacy? Yes.   (Agent: If no, request that the patient contact the pharmacy for the refill.) (Agent: If yes, when and what did the pharmacy advise?) they need authorization   Preferred Pharmacy (with phone number or street name): [facility]  Agent: Please be advised that RX refills may take up to 3 business days. We ask that you follow-up with your pharmacy.

## 2018-08-23 MED ORDER — AMLODIPINE BESYLATE 2.5 MG PO TABS: 2.5000 mg | ORAL_TABLET | Freq: Every day | ORAL | 0 refills | Status: DC

## 2018-08-23 NOTE — Telephone Encounter (Signed)
Pt's last office visit 09/15/17; no upcoming visits noted; attempted to contact pt but unable to leave message at [phone]; pre-recorded message states "voicemail has not been set up".

## 2018-08-23 NOTE — Telephone Encounter (Signed)
Pt's last office visit 09/15/17; no upcoming visits noted; left message on voicemail [phone]; when pt calls back please schedule appointment.  Requested Prescriptions  Pending Prescriptions Disp Refills  . amLODipine (NORVASC) 2.5 MG tablet 60 tablet 0    Sig: Take 1 tablet (2.5 mg total) by mouth daily.     Cardiovascular:  Calcium Channel Blockers Failed - 08/22/2018 10:48 AM      Failed - Valid encounter within last 6 months    Recent Outpatient Visits          11 months ago Essential hypertension   Nature conservation officer at [facility] [facility], [place], NP   11 months ago Essential hypertension   Nature conservation officer at [employer], [place], NP   1 year ago Skin rash   Nature conservation officer at [employer], [employer], DO   2 years ago History of dysphasia   Nature conservation officer at [employer], [place], MD   3 years ago Acute upper respiratory infection   Nature conservation officer at [employer], [place], NP             Passed - Last BP in normal range    BP Readings from Last 1 Encounters:  09/15/17 120/84

## 2018-08-24 NOTE — Telephone Encounter (Signed)
Spoke to the pt.  He does not need a refill of this medication at this time.  No further action required.

## 2018-08-24 NOTE — Telephone Encounter (Signed)
Spoke to the pt and he does NOT need a refill of this medication at this time.  No further action required.

## 2018-09-26 ENCOUNTER — Other Ambulatory Visit: Payer: Self-pay | Admitting: Adult Health

## 2018-09-27 ENCOUNTER — Encounter: Payer: Self-pay | Admitting: Family Medicine

## 2018-09-27 NOTE — Telephone Encounter (Signed)
Pt calling about RX simvastatin please call pt at [phone] when RX has sent over to pharmacy

## 2018-09-27 NOTE — Telephone Encounter (Signed)
Sent in for 30 days.  Contact letter sent to the patient.  Now due for cpx and fasting lab work.

## 2018-10-11 ENCOUNTER — Other Ambulatory Visit: Payer: Self-pay | Admitting: Adult Health

## 2018-10-12 NOTE — Telephone Encounter (Signed)
Pt given 30 more days.  A contact letter was sent in Nov.

## 2018-11-01 ENCOUNTER — Ambulatory Visit (INDEPENDENT_AMBULATORY_CARE_PROVIDER_SITE_OTHER): Payer: Medicare Other | Admitting: Adult Health

## 2018-11-01 ENCOUNTER — Encounter: Payer: Self-pay | Admitting: Adult Health

## 2018-11-01 VITALS — BP 130/84 | Temp 97.4°F | Ht 70.0 in | Wt 221.0 lb

## 2018-11-01 DIAGNOSIS — E785 Hyperlipidemia, unspecified: Secondary | ICD-10-CM | POA: Diagnosis not present

## 2018-11-01 DIAGNOSIS — N401 Enlarged prostate with lower urinary tract symptoms: Secondary | ICD-10-CM

## 2018-11-01 DIAGNOSIS — I10 Essential (primary) hypertension: Secondary | ICD-10-CM

## 2018-11-01 DIAGNOSIS — Z23 Encounter for immunization: Secondary | ICD-10-CM | POA: Diagnosis not present

## 2018-11-01 DIAGNOSIS — R351 Nocturia: Secondary | ICD-10-CM | POA: Diagnosis not present

## 2018-11-01 LAB — CBC WITH DIFFERENTIAL/PLATELET
Basophils Absolute: 0.1 10*3/uL (ref 0.0–0.1)
Basophils Relative: 0.7 % (ref 0.0–3.0)
Eosinophils Absolute: 0.1 10*3/uL (ref 0.0–0.7)
Eosinophils Relative: 1.9 % (ref 0.0–5.0)
HCT: 45.2 % (ref 39.0–52.0)
Hemoglobin: 15.3 g/dL (ref 13.0–17.0)
Lymphocytes Relative: 17.7 % (ref 12.0–46.0)
Lymphs Abs: 1.3 10*3/uL (ref 0.7–4.0)
MCHC: 33.9 g/dL (ref 30.0–36.0)
MCV: 90.3 fl (ref 78.0–100.0)
MONO ABS: 0.7 10*3/uL (ref 0.1–1.0)
Monocytes Relative: 9.3 % (ref 3.0–12.0)
Neutro Abs: 5 10*3/uL (ref 1.4–7.7)
Neutrophils Relative %: 70.4 % (ref 43.0–77.0)
Platelets: 212 10*3/uL (ref 150.0–400.0)
RBC: 5.01 Mil/uL (ref 4.22–5.81)
RDW: 13.8 % (ref 11.5–15.5)
WBC: 7.1 10*3/uL (ref 4.0–10.5)

## 2018-11-01 LAB — PSA: PSA: 2.64 ng/mL (ref 0.10–4.00)

## 2018-11-01 LAB — HEPATIC FUNCTION PANEL
ALT: 35 U/L (ref 0–53)
AST: 25 U/L (ref 0–37)
Albumin: 4.2 g/dL (ref 3.5–5.2)
Alkaline Phosphatase: 40 U/L (ref 39–117)
BILIRUBIN DIRECT: 0.1 mg/dL (ref 0.0–0.3)
Total Bilirubin: 0.7 mg/dL (ref 0.2–1.2)
Total Protein: 7.1 g/dL (ref 6.0–8.3)

## 2018-11-01 LAB — LIPID PANEL
Cholesterol: 158 mg/dL (ref 0–200)
HDL: 33.4 mg/dL — ABNORMAL LOW (ref >39.00)
LDL Cholesterol: 100 mg/dL — ABNORMAL HIGH (ref 0–99)
NonHDL: 124.51
Total CHOL/HDL Ratio: 5
Triglycerides: 121 mg/dL (ref 0.0–149.0)
VLDL: 24.2 mg/dL (ref 0.0–40.0)

## 2018-11-01 LAB — BASIC METABOLIC PANEL
BUN: 23 mg/dL (ref 6–23)
CALCIUM: 9.5 mg/dL (ref 8.4–10.5)
CO2: 28 meq/L (ref 19–32)
Chloride: 100 mEq/L (ref 96–112)
Creatinine, Ser: 1.44 mg/dL (ref 0.40–1.50)
GFR: 62.21 mL/min (ref >60.00)
GLUCOSE: 90 mg/dL (ref 70–99)
Potassium: 4 mEq/L (ref 3.5–5.1)
Sodium: 137 mEq/L (ref 135–145)

## 2018-11-01 LAB — TSH: TSH: 1.19 u[IU]/mL (ref 0.35–4.50)

## 2018-11-01 MED ORDER — LISINOPRIL-HYDROCHLOROTHIAZIDE 20-25 MG PO TABS: 1.0000 | ORAL_TABLET | Freq: Every morning | ORAL | 3 refills | Status: DC

## 2018-11-01 MED ORDER — AMLODIPINE BESYLATE 2.5 MG PO TABS: ORAL_TABLET | ORAL | 3 refills | Status: DC

## 2018-11-01 MED ORDER — DOXAZOSIN MESYLATE 8 MG PO TABS: ORAL_TABLET | ORAL | 3 refills | Status: DC

## 2018-11-01 MED ORDER — SIMVASTATIN 20 MG PO TABS: 20.0000 mg | ORAL_TABLET | Freq: Every day | ORAL | 3 refills | Status: DC

## 2018-11-01 MED ORDER — BENZONATATE 200 MG PO CAPS: 200.0000 mg | ORAL_CAPSULE | Freq: Two times a day (BID) | ORAL | 0 refills | Status: AC | PRN

## 2018-11-01 NOTE — Patient Instructions (Signed)
It was great seeing you today   We will follow up with you regarding your blood work   Please follow up with Gastroenterology about the difficulty swallowing   Phone: [phone]

## 2018-11-01 NOTE — Progress Notes (Signed)
Subjective:    Patient ID: Jared Bryant, male    DOB: 12/02/1945, 72 y.o.   MRN: [id number]  HPI  Patient presents for yearly preventative medicine examination. He is a pleasant 72 year old male who  has a past medical history of Allergy, BPH (benign prostatic hypertrophy), ED (erectile dysfunction), Hyperlipidemia, and Hypertension.  Hyperlipidemia - takes Zocor 20 mg daily.  Lab Results  Component Value Date   CHOL 134 09/15/2017   HDL 28.70 (L) 09/15/2017   LDLCALC 87 09/15/2017   TRIG 92.0 09/15/2017   CHOLHDL 5 09/15/2017   Essential Hypertension - Takes Lisinopril HCTZ and norvasc  BP Readings from Last 3 Encounters:  11/01/18 130/84  09/15/17 120/84  09/07/17 116/84   BPH - managed with Cardura   GERD - controlled with omeprazole.   Dysphagia - continues to have issues with feeling as though food is getting stuck when he swallows. This is intermittent. Had Endoscopy done in 2017 and was seen by GI. Endoscopy showed small to medium sized hiatal hernia.   All immunizations and health maintenance protocols were reviewed with the patient and needed orders were placed. Need flu shot   Appropriate screening laboratory values were ordered for the patient including screening of hyperlipidemia, renal function and hepatic function. If indicated by BPH, a PSA was ordered.  Medication reconciliation,  past medical history, social history, problem list and allergies were reviewed in detail with the patient  Goals were established with regard to weight loss, exercise, and  diet in compliance with medications. He stays active with officiating football, basketball, and baseball.   End of life planning was discussed.  He is up-to-date on routine screening exams such as colonoscopy, dental and vision screens  Review of Systems  Constitutional: Negative.   HENT: Negative.   Eyes: Negative.   Respiratory: Negative.   Cardiovascular: Negative.   Gastrointestinal: Negative.     Endocrine: Negative.   Genitourinary: Negative.   Musculoskeletal: Positive for arthralgias and back pain.  Allergic/Immunologic: Negative.   Neurological: Negative.   Hematological: Negative.   Psychiatric/Behavioral: Negative.   All other systems reviewed and are negative.  Past Medical History:  Diagnosis Date  . Allergy   . BPH (benign prostatic hypertrophy)   . ED (erectile dysfunction)   . Hyperlipidemia   . Hypertension     Social History   Socioeconomic History  . Marital status: Married    Spouse name: Not on file  . Number of children: Not on file  . Years of education: Not on file  . Highest education level: Not on file  Occupational History  . Occupation: retired  Engineer, productionocial Needs  . Financial resource strain: Not on file  . Food insecurity:    Worry: Not on file    Inability: Not on file  . Transportation needs:    Medical: Not on file    Non-medical: Not on file  Tobacco Use  . Smoking status: Never Smoker  . Smokeless tobacco: Never Used  Substance and Sexual Activity  . Alcohol use: No  . Drug use: No  . Sexual activity: Not on file  Lifestyle  . Physical activity:    Days per week: Not on file    Minutes per session: Not on file  . Stress: Not on file  Relationships  . Social connections:    Talks on phone: Not on file    Gets together: Not on file    Attends religious service: Not on file  Active member of club or organization: Not on file    Attends meetings of clubs or organizations: Not on file    Relationship status: Not on file  . Intimate partner violence:    Fear of current or ex partner: Not on file    Emotionally abused: Not on file    Physically abused: Not on file    Forced sexual activity: Not on file  Other Topics Concern  . Not on file  Social History Narrative   Married    Two children - both live in [place]    Two Step Children     Past Surgical History:  Procedure Laterality Date  . COLONOSCOPY N/A 12/21/2013    Procedure: COLONOSCOPY;  Surgeon: Rachael Fee, MD;  Location: [facility];  Service: Endoscopy;  Laterality: N/A;  . TONSILLECTOMY      Family History  Problem Relation Age of Onset  . Heart disease Mother   . Heart disease Father     No Known Allergies  Current Outpatient Medications on File Prior to Visit  Medication Sig Dispense Refill  . fluticasone (FLONASE) 50 MCG/ACT nasal spray Place 2 sprays daily into both nostrils. 16 g 6  . omeprazole (PRILOSEC) 40 MG capsule Take 1 capsule (40 mg total) by mouth daily before breakfast. 90 capsule 3   No current facility-administered medications on file prior to visit.     BP 130/84   Temp (!) 97.4 F (36.3 C)   Ht 5\' 10"  (1.778 m)   Wt 221 lb (100.2 kg)   BMI 31.71 kg/m       Objective:   Physical Exam Vitals signs and nursing note reviewed.  Constitutional:      Appearance: Normal appearance.  HENT:     Head: Normocephalic and atraumatic.     Right Ear: Tympanic membrane, ear canal and external ear normal.     Left Ear: Tympanic membrane, ear canal and external ear normal.     Nose: Nose normal. No congestion or rhinorrhea.     Mouth/Throat:     Mouth: Mucous membranes are moist.     Pharynx: Oropharynx is clear. No oropharyngeal exudate or posterior oropharyngeal erythema.  Eyes:     Extraocular Movements: Extraocular movements intact.     Conjunctiva/sclera: Conjunctivae normal.     Pupils: Pupils are equal, round, and reactive to light.  Neck:     Musculoskeletal: Normal range of motion and neck supple.  Cardiovascular:     Rate and Rhythm: Normal rate and regular rhythm.     Pulses: Normal pulses.     Heart sounds: Normal heart sounds. No murmur. No friction rub. No gallop.   Pulmonary:     Effort: Pulmonary effort is normal. No respiratory distress.     Breath sounds: Normal breath sounds. No stridor. No wheezing, rhonchi or rales.  Chest:     Chest wall: No tenderness.  Abdominal:     General:  Abdomen is flat. Bowel sounds are normal.     Palpations: Abdomen is soft.  Musculoskeletal: Normal range of motion.        General: No swelling, tenderness or deformity.  Skin:    General: Skin is warm and dry.     Capillary Refill: Capillary refill takes less than 2 seconds.     Coloration: Skin is not jaundiced or pale.     Findings: No bruising, erythema, lesion or rash.  Neurological:     General: No focal deficit present.  Mental Status: He is alert and oriented to person, place, and time.     Cranial Nerves: No cranial nerve deficit.     Sensory: No sensory deficit.     Motor: No weakness.     Coordination: Coordination normal.     Gait: Gait normal.     Deep Tendon Reflexes: Reflexes normal.  Psychiatric:        Mood and Affect: Mood normal.        Behavior: Behavior normal.        Thought Content: Thought content normal.        Judgment: Judgment normal.       Assessment & Plan:  1. BPH associated with nocturia  - doxazosin (CARDURA) 8 MG tablet; Take 1 tablet by mouth at bedtime.  Dispense: 90 tablet; Refill: 3 - PSA  2. Essential hypertension - Well controlled. No change in medications  - amLODipine (NORVASC) 2.5 MG tablet; Take 1 tablet by mouth daily.  Dispense: 90 tablet; Refill: 3 - lisinopril-hydrochlorothiazide (PRINZIDE,ZESTORETIC) 20-25 MG tablet; Take 1 tablet by mouth every morning.  Dispense: 90 tablet; Refill: 3 - benzonatate (TESSALON) 200 MG capsule; Take 1 capsule (200 mg total) by mouth 2 (two) times daily as needed for cough.  Dispense: 20 capsule; Refill: 0 - Basic metabolic panel - CBC with Differential/Platelet - Hepatic function panel - Lipid panel - TSH - PSA  3. Hyperlipidemia, unspecified hyperlipidemia type - Consider increasing statin  - Encouraged to continue heart healthy diet and to stay active  - simvastatin (ZOCOR) 20 MG tablet; Take 1 tablet (20 mg total) by mouth daily.  Dispense: 90 tablet; Refill: 3 - Basic metabolic  panel - CBC with Differential/Platelet - Hepatic function panel - Lipid panel - TSH - PSA  4. Need for influenza vaccination  - Flu vaccine HIGH DOSE PF (Fluzone High dose)   Shirline Freesory Rohen Kimes, NP

## 2018-12-08 ENCOUNTER — Other Ambulatory Visit: Payer: Self-pay | Admitting: Adult Health

## 2018-12-08 DIAGNOSIS — I10 Essential (primary) hypertension: Secondary | ICD-10-CM

## 2019-09-13 DIAGNOSIS — Z20828 Contact with and (suspected) exposure to other viral communicable diseases: Secondary | ICD-10-CM | POA: Diagnosis not present

## 2019-10-23 DIAGNOSIS — Z20828 Contact with and (suspected) exposure to other viral communicable diseases: Secondary | ICD-10-CM | POA: Diagnosis not present

## 2019-11-10 ENCOUNTER — Other Ambulatory Visit: Payer: Self-pay | Admitting: Adult Health

## 2019-11-10 DIAGNOSIS — I10 Essential (primary) hypertension: Secondary | ICD-10-CM

## 2019-11-14 NOTE — Telephone Encounter (Signed)
Patient need to schedule an ov for more refills. 

## 2019-11-14 NOTE — Telephone Encounter (Signed)
Called pt no answer °

## 2019-11-20 ENCOUNTER — Other Ambulatory Visit: Payer: Self-pay

## 2019-11-21 ENCOUNTER — Other Ambulatory Visit: Payer: Self-pay | Admitting: Adult Health

## 2019-11-21 ENCOUNTER — Encounter: Payer: Self-pay | Admitting: Adult Health

## 2019-11-21 ENCOUNTER — Ambulatory Visit (INDEPENDENT_AMBULATORY_CARE_PROVIDER_SITE_OTHER): Payer: Medicare HMO | Admitting: Adult Health

## 2019-11-21 VITALS — BP 100/70 | HR 63 | Temp 97.2°F | Ht 70.0 in | Wt 233.0 lb

## 2019-11-21 DIAGNOSIS — N289 Disorder of kidney and ureter, unspecified: Secondary | ICD-10-CM

## 2019-11-21 DIAGNOSIS — R351 Nocturia: Secondary | ICD-10-CM

## 2019-11-21 DIAGNOSIS — R944 Abnormal results of kidney function studies: Secondary | ICD-10-CM

## 2019-11-21 DIAGNOSIS — R1319 Other dysphagia: Secondary | ICD-10-CM

## 2019-11-21 DIAGNOSIS — Z0000 Encounter for general adult medical examination without abnormal findings: Secondary | ICD-10-CM

## 2019-11-21 DIAGNOSIS — E785 Hyperlipidemia, unspecified: Secondary | ICD-10-CM | POA: Diagnosis not present

## 2019-11-21 DIAGNOSIS — I10 Essential (primary) hypertension: Secondary | ICD-10-CM | POA: Diagnosis not present

## 2019-11-21 DIAGNOSIS — N401 Enlarged prostate with lower urinary tract symptoms: Secondary | ICD-10-CM | POA: Diagnosis not present

## 2019-11-21 LAB — LIPID PANEL
Cholesterol: 163 mg/dL (ref 0–200)
HDL: 30 mg/dL — ABNORMAL LOW (ref >39.00)
LDL Cholesterol: 112 mg/dL — ABNORMAL HIGH (ref 0–99)
NonHDL: 132.98
Total CHOL/HDL Ratio: 5
Triglycerides: 107 mg/dL (ref 0.0–149.0)
VLDL: 21.4 mg/dL (ref 0.0–40.0)

## 2019-11-21 LAB — CBC WITH DIFFERENTIAL/PLATELET
Basophils Absolute: 0.1 10*3/uL (ref 0.0–0.1)
Basophils Relative: 1 % (ref 0.0–3.0)
Eosinophils Absolute: 0.1 10*3/uL (ref 0.0–0.7)
Eosinophils Relative: 1.8 % (ref 0.0–5.0)
HCT: 42.1 % (ref 39.0–52.0)
Hemoglobin: 14.3 g/dL (ref 13.0–17.0)
Lymphocytes Relative: 20.6 % (ref 12.0–46.0)
Lymphs Abs: 1.5 10*3/uL (ref 0.7–4.0)
MCHC: 33.9 g/dL (ref 30.0–36.0)
MCV: 89.6 fl (ref 78.0–100.0)
Monocytes Absolute: 0.6 10*3/uL (ref 0.1–1.0)
Monocytes Relative: 9 % (ref 3.0–12.0)
Neutro Abs: 4.8 10*3/uL (ref 1.4–7.7)
Neutrophils Relative %: 67.6 % (ref 43.0–77.0)
Platelets: 200 10*3/uL (ref 150.0–400.0)
RBC: 4.7 Mil/uL (ref 4.22–5.81)
RDW: 14 % (ref 11.5–15.5)
WBC: 7.1 10*3/uL (ref 4.0–10.5)

## 2019-11-21 LAB — COMPREHENSIVE METABOLIC PANEL
ALT: 43 U/L (ref 0–53)
AST: 40 U/L — ABNORMAL HIGH (ref 0–37)
Albumin: 4 g/dL (ref 3.5–5.2)
Alkaline Phosphatase: 41 U/L (ref 39–117)
BUN: 34 mg/dL — ABNORMAL HIGH (ref 6–23)
CO2: 25 mEq/L (ref 19–32)
Calcium: 9.4 mg/dL (ref 8.4–10.5)
Chloride: 103 mEq/L (ref 96–112)
Creatinine, Ser: 1.65 mg/dL — ABNORMAL HIGH (ref 0.40–1.50)
GFR: 49.87 mL/min — ABNORMAL LOW (ref >60.00)
Glucose, Bld: 98 mg/dL (ref 70–99)
Potassium: 3.9 mEq/L (ref 3.5–5.1)
Sodium: 137 mEq/L (ref 135–145)
Total Bilirubin: 0.5 mg/dL (ref 0.2–1.2)
Total Protein: 6.8 g/dL (ref 6.0–8.3)

## 2019-11-21 LAB — TSH: TSH: 0.85 u[IU]/mL (ref 0.35–4.50)

## 2019-11-21 LAB — PSA: PSA: 3.31 ng/mL (ref 0.10–4.00)

## 2019-11-21 MED ORDER — AMLODIPINE BESYLATE 2.5 MG PO TABS: ORAL_TABLET | ORAL | 3 refills | Status: DC

## 2019-11-21 MED ORDER — SIMVASTATIN 20 MG PO TABS: 20.0000 mg | ORAL_TABLET | Freq: Every day | ORAL | 3 refills | Status: AC

## 2019-11-21 MED ORDER — OMEPRAZOLE 40 MG PO CPDR: 40.0000 mg | DELAYED_RELEASE_CAPSULE | Freq: Every day | ORAL | 3 refills | Status: AC

## 2019-11-21 MED ORDER — DOXAZOSIN MESYLATE 8 MG PO TABS: ORAL_TABLET | ORAL | 3 refills | Status: DC

## 2019-11-21 MED ORDER — LISINOPRIL-HYDROCHLOROTHIAZIDE 20-25 MG PO TABS: 1.0000 | ORAL_TABLET | Freq: Every morning | ORAL | 3 refills | Status: DC

## 2019-11-21 NOTE — Patient Instructions (Signed)
It was great seeing you today   We will follow up with you regarding your blood work   Please work on weight loss through diet and exercise over the next year   All of your medications have been sent to [facility]   Someone form GI will call you to schedule your appointment with Dr. Christella Hartigan

## 2019-11-21 NOTE — Addendum Note (Signed)
Addended by: Waymon Amato R on: 11/21/2019 03:34 PM   Modules accepted: Orders

## 2019-11-21 NOTE — Progress Notes (Signed)
Subjective:    Patient ID: Jared Bryant, male    DOB: 28-Jun-1946, 74 y.o.   MRN: [id number]  HPI Patient presents for yearly preventative medicine examination. He is a pleasant 74 year old male who  has a past medical history of Allergy, BPH (benign prostatic hypertrophy), ED (erectile dysfunction), Hyperlipidemia, and Hypertension.   Hyperlipidemia-takes Zocor 20 mg daily.  He denies myalgia or fatigue Lab Results  Component Value Date   CHOL 158 11/01/2018   HDL 33.40 (L) 11/01/2018   LDLCALC 100 (H) 11/01/2018   TRIG 121.0 11/01/2018   CHOLHDL 5 11/01/2018   Hypertension-currently controlled with lisinopril/hydrochlorothiazide and Norvasc.  He denies dizziness, lightheadedness, chest pain, or shortness of breath BP Readings from Last 3 Encounters:  11/21/19 100/70  11/01/18 130/84  09/15/17 120/84   BPH -symptoms managed with Cardura.  GERD -controlled with omeprazole  Dysphagia - continues to have issues with feeling as though food is getting stuck when he swallows food. He has had issues with this in the past and was last seen by GI in 2017. He does have known small sized hiatal hernia   All immunizations and health maintenance protocols were reviewed with the patient and needed orders were placed.  Appropriate screening laboratory values were ordered for the patient including screening of hyperlipidemia, renal function and hepatic function. If indicated by BPH, a PSA was ordered.  Medication reconciliation,  past medical history, social history, problem list and allergies were reviewed in detail with the patient  Goals were established with regard to weight loss, exercise, and  diet in compliance with medications. His weight is up over the last year, in the past he was active with officiating high school sports. He is trying to walk on a routine basis. He is snacking more.  Wt Readings from Last 3 Encounters:  11/21/19 233 lb (105.7 kg)  11/01/18 221 lb (100.2 kg)   09/15/17 229 lb (103.9 kg)    End of life planning was discussed.  He is up-to-date on routine screening exams such as colonoscopy, dental, and vision screens  Review of Systems  Constitutional: Negative.   HENT: Negative.   Eyes: Negative.   Respiratory: Positive for choking.   Cardiovascular: Negative.   Gastrointestinal: Negative.   Endocrine: Negative.   Genitourinary: Negative.   Musculoskeletal: Negative.   Skin: Negative.   Allergic/Immunologic: Negative.   Neurological: Negative.   Hematological: Negative.   Psychiatric/Behavioral: Negative.   All other systems reviewed and are negative.  Past Medical History:  Diagnosis Date  . Allergy   . BPH (benign prostatic hypertrophy)   . ED (erectile dysfunction)   . Hyperlipidemia   . Hypertension     Social History   Socioeconomic History  . Marital status: Married    Spouse name: Not on file  . Number of children: Not on file  . Years of education: Not on file  . Highest education level: Not on file  Occupational History  . Occupation: retired  Tobacco Use  . Smoking status: Never Smoker  . Smokeless tobacco: Never Used  Substance and Sexual Activity  . Alcohol use: No  . Drug use: No  . Sexual activity: Not on file  Other Topics Concern  . Not on file  Social History Narrative   Married    Two children - both live in [place]    Two Step Children    Social Determinants of Health   Financial Resource Strain:   . Difficulty of Paying  Living Expenses: Not on file  Food Insecurity:   . Worried About Programme researcher, broadcasting/film/video in the Last Year: Not on file  . Ran Out of Food in the Last Year: Not on file  Transportation Needs:   . Lack of Transportation (Medical): Not on file  . Lack of Transportation (Non-Medical): Not on file  Physical Activity:   . Days of Exercise per Week: Not on file  . Minutes of Exercise per Session: Not on file  Stress:   . Feeling of Stress : Not on file  Social  Connections:   . Frequency of Communication with Friends and Family: Not on file  . Frequency of Social Gatherings with Friends and Family: Not on file  . Attends Religious Services: Not on file  . Active Member of Clubs or Organizations: Not on file  . Attends Banker Meetings: Not on file  . Marital Status: Not on file  Intimate Partner Violence:   . Fear of Current or Ex-Partner: Not on file  . Emotionally Abused: Not on file  . Physically Abused: Not on file  . Sexually Abused: Not on file    Past Surgical History:  Procedure Laterality Date  . COLONOSCOPY N/A 12/21/2013   Procedure: COLONOSCOPY;  Surgeon: Rachael Fee, MD;  Location: [facility];  Service: Endoscopy;  Laterality: N/A;  . TONSILLECTOMY      Family History  Problem Relation Age of Onset  . Heart disease Mother   . Heart disease Father     No Known Allergies  Current Outpatient Medications on File Prior to Visit  Medication Sig Dispense Refill  . amLODipine (NORVASC) 2.5 MG tablet Take 1 tablet by mouth daily. 90 tablet 3  . benzonatate (TESSALON) 200 MG capsule Take 1 capsule (200 mg total) by mouth 2 (two) times daily as needed for cough. 20 capsule 0  . doxazosin (CARDURA) 8 MG tablet Take 1 tablet by mouth at bedtime. 90 tablet 3  . simvastatin (ZOCOR) 20 MG tablet Take 1 tablet (20 mg total) by mouth daily. 90 tablet 3  . fluticasone (FLONASE) 50 MCG/ACT nasal spray Place 2 sprays daily into both nostrils. (Patient not taking: Reported on 11/21/2019) 16 g 6  . lisinopril-hydrochlorothiazide (PRINZIDE,ZESTORETIC) 20-25 MG tablet Take 1 tablet by mouth every morning. (Patient not taking: Reported on 11/21/2019) 90 tablet 3  . omeprazole (PRILOSEC) 40 MG capsule Take 1 capsule (40 mg total) by mouth daily before breakfast. (Patient not taking: Reported on 11/21/2019) 90 capsule 3   No current facility-administered medications on file prior to visit.    BP 100/70   Pulse 63   Temp (!) 97.2  F (36.2 C) (Other (Comment))   Ht 5\' 10"  (1.778 m)   Wt 233 lb (105.7 kg)   SpO2 98%   BMI 33.43 kg/m       Objective:   Physical Exam Vitals and nursing note reviewed.  Constitutional:      General: He is not in acute distress.    Appearance: He is well-developed. He is obese. He is not diaphoretic.  HENT:     Head: Normocephalic and atraumatic.     Right Ear: Tympanic membrane, ear canal and external ear normal. There is no impacted cerumen.     Left Ear: Tympanic membrane, ear canal and external ear normal. There is no impacted cerumen.     Nose: Nose normal. No congestion or rhinorrhea.     Mouth/Throat:     Mouth: Mucous  membranes are moist.     Pharynx: Oropharynx is clear. No oropharyngeal exudate or posterior oropharyngeal erythema.  Eyes:     General:        Right eye: No discharge.        Left eye: No discharge.     Conjunctiva/sclera: Conjunctivae normal.     Pupils: Pupils are equal, round, and reactive to light.  Neck:     Thyroid: No thyromegaly.     Vascular: No carotid bruit.     Trachea: No tracheal deviation.  Cardiovascular:     Rate and Rhythm: Normal rate and regular rhythm.     Pulses: Normal pulses.     Heart sounds: Normal heart sounds. No murmur. No friction rub. No gallop.   Pulmonary:     Effort: Pulmonary effort is normal. No respiratory distress.     Breath sounds: Normal breath sounds. No stridor. No wheezing, rhonchi or rales.  Chest:     Chest wall: No tenderness.  Abdominal:     General: Bowel sounds are normal. There is no distension.     Palpations: Abdomen is soft. There is no mass.     Tenderness: There is no abdominal tenderness. There is no right CVA tenderness, left CVA tenderness, guarding or rebound.     Hernia: No hernia is present.  Musculoskeletal:        General: No swelling, tenderness, deformity or signs of injury. Normal range of motion.     Right lower leg: No edema.     Left lower leg: No edema.  Lymphadenopathy:      Cervical: No cervical adenopathy.  Skin:    General: Skin is warm and dry.     Capillary Refill: Capillary refill takes less than 2 seconds.     Coloration: Skin is not jaundiced or pale.     Findings: No bruising, erythema, lesion or rash.  Neurological:     General: No focal deficit present.     Mental Status: He is alert and oriented to person, place, and time. Mental status is at baseline.     Cranial Nerves: No cranial nerve deficit.     Sensory: No sensory deficit.     Motor: No weakness.     Coordination: Coordination normal.     Gait: Gait normal.     Deep Tendon Reflexes: Reflexes normal.  Psychiatric:        Mood and Affect: Mood normal.        Behavior: Behavior normal.        Thought Content: Thought content normal.        Judgment: Judgment normal.        Assessment & Plan:  1. Routine general medical examination at a health care facility - Follow up in one year  - He was encouraged to work on diet and exercise to help lose weight  - Refer to GI for dysphagia  - CBC with Differential/Platelet - Comprehensive metabolic panel - Lipid panel - TSH  2. BPH associated with nocturia - Continue with Cardura  - doxazosin (CARDURA) 8 MG tablet; Take 1 tablet by mouth at bedtime.  Dispense: 90 tablet; Refill: 3 - PSA  3. Essential hypertension - No change in medications  - amLODipine (NORVASC) 2.5 MG tablet; Take 1 tablet by mouth daily.  Dispense: 90 tablet; Refill: 3 - lisinopril-hydrochlorothiazide (ZESTORETIC) 20-25 MG tablet; Take 1 tablet by mouth every morning.  Dispense: 90 tablet; Refill: 3 - CBC with Differential/Platelet - Comprehensive metabolic  panel - Lipid panel - TSH  4. Hyperlipidemia, unspecified hyperlipidemia type - Consider increase in Zocor  - simvastatin (ZOCOR) 20 MG tablet; Take 1 tablet (20 mg total) by mouth daily.  Dispense: 90 tablet; Refill: 3 - CBC with Differential/Platelet - Comprehensive metabolic panel - Lipid panel -  TSH  5. Other dysphagia  - Ambulatory referral to Gastroenterology

## 2019-11-29 ENCOUNTER — Other Ambulatory Visit (INDEPENDENT_AMBULATORY_CARE_PROVIDER_SITE_OTHER): Payer: Medicare HMO

## 2019-11-29 ENCOUNTER — Other Ambulatory Visit: Payer: Self-pay

## 2019-11-29 DIAGNOSIS — N289 Disorder of kidney and ureter, unspecified: Secondary | ICD-10-CM

## 2019-11-29 DIAGNOSIS — R944 Abnormal results of kidney function studies: Secondary | ICD-10-CM | POA: Diagnosis not present

## 2019-11-29 LAB — BASIC METABOLIC PANEL
BUN: 28 mg/dL — ABNORMAL HIGH (ref 6–23)
CO2: 26 mEq/L (ref 19–32)
Calcium: 9.4 mg/dL (ref 8.4–10.5)
Chloride: 101 mEq/L (ref 96–112)
Creatinine, Ser: 1.66 mg/dL — ABNORMAL HIGH (ref 0.40–1.50)
GFR: 49.52 mL/min — ABNORMAL LOW (ref >60.00)
Glucose, Bld: 96 mg/dL (ref 70–99)
Potassium: 4.1 mEq/L (ref 3.5–5.1)
Sodium: 135 mEq/L (ref 135–145)

## 2019-11-29 LAB — COMPREHENSIVE METABOLIC PANEL
ALT: 41 U/L (ref 0–53)
AST: 23 U/L (ref 0–37)
Albumin: 4 g/dL (ref 3.5–5.2)
Alkaline Phosphatase: 41 U/L (ref 39–117)
BUN: 28 mg/dL — ABNORMAL HIGH (ref 6–23)
CO2: 26 mEq/L (ref 19–32)
Calcium: 9.4 mg/dL (ref 8.4–10.5)
Chloride: 101 mEq/L (ref 96–112)
Creatinine, Ser: 1.66 mg/dL — ABNORMAL HIGH (ref 0.40–1.50)
GFR: 49.52 mL/min — ABNORMAL LOW (ref >60.00)
Glucose, Bld: 96 mg/dL (ref 70–99)
Potassium: 4.1 mEq/L (ref 3.5–5.1)
Sodium: 135 mEq/L (ref 135–145)
Total Bilirubin: 0.5 mg/dL (ref 0.2–1.2)
Total Protein: 6.7 g/dL (ref 6.0–8.3)

## 2019-12-01 MED ORDER — LISINOPRIL 30 MG PO TABS: 30.0000 mg | ORAL_TABLET | Freq: Every day | ORAL | 0 refills | Status: DC

## 2019-12-19 ENCOUNTER — Encounter: Payer: Self-pay | Admitting: Adult Health

## 2020-02-29 ENCOUNTER — Other Ambulatory Visit: Payer: Self-pay | Admitting: Adult Health

## 2020-02-29 NOTE — Telephone Encounter (Signed)
Sent to the pharmacy by e-scribe. 

## 2020-06-04 ENCOUNTER — Telehealth: Payer: Self-pay | Admitting: Adult Health

## 2020-06-04 DIAGNOSIS — I10 Essential (primary) hypertension: Secondary | ICD-10-CM

## 2020-06-04 MED ORDER — LISINOPRIL-HYDROCHLOROTHIAZIDE 20-25 MG PO TABS: 1.0000 | ORAL_TABLET | Freq: Every morning | ORAL | 1 refills | Status: DC

## 2020-06-04 NOTE — Telephone Encounter (Signed)
Pt is calling in stating that he is suppose to be taking lisinopril (ZESTRIL) 30 MG and now he went back to the pharmacy and they stated that he was given  lisinopril HTZ 20-25 MG last week and wanted to know which one he should be getting from the pharmacy.  Pt would like to have a call back to get things squared away.

## 2020-06-04 NOTE — Telephone Encounter (Signed)
Pt should be taking lisinopril/hctz 20-25 mg.  Sent to the pharmacy by e-scribe for 6 months.  Left a message for a return call.

## 2020-06-05 NOTE — Telephone Encounter (Signed)
Error.  Pt should be on lisinopril 30 mg after more review of the chart.  Called the pharmacy and cancelled rx that was sent in on 06/04/20.  Spoke to the pt and advised that he should be on the 30 mg and not combination tab.  Pt will continue with 30 mg tab.  Nothing further needed.

## 2020-08-13 ENCOUNTER — Telehealth: Payer: Self-pay | Admitting: Adult Health

## 2020-08-13 NOTE — Telephone Encounter (Signed)
He should be taking Lisinopril 30 mg

## 2020-08-13 NOTE — Telephone Encounter (Signed)
The pharmacy need to verify medication for Lisinopril. pt is requesting a medication that is canceled off his medication list. please advise if the pharmacy should fill   call [facility] [phone]    [facility] # [address], [place] - [address]  Phone:  [phone]

## 2020-08-13 NOTE — Telephone Encounter (Signed)
Spoke with the pharmacist Joselyn Glassman at [facility] stated the pt was confused about his medication and thought he should be taking the combo pill.  Joselyn Glassman stated the pt picked up a refill on Lisinopril 30mg  in September.  I informed October of the message below per the PCP and called the pt and informed him of this as well.  Patient stated the last refill he picked up was for the combo and will contact [facility].

## 2020-08-28 DIAGNOSIS — R69 Illness, unspecified: Secondary | ICD-10-CM | POA: Diagnosis not present

## 2020-09-25 ENCOUNTER — Other Ambulatory Visit: Payer: Self-pay

## 2020-09-25 ENCOUNTER — Ambulatory Visit (INDEPENDENT_AMBULATORY_CARE_PROVIDER_SITE_OTHER): Payer: Medicare HMO | Admitting: Adult Health

## 2020-09-25 ENCOUNTER — Encounter: Payer: Self-pay | Admitting: Adult Health

## 2020-09-25 VITALS — BP 114/66 | HR 90 | Temp 98.0°F | Resp 18 | Wt 227.8 lb

## 2020-09-25 DIAGNOSIS — H109 Unspecified conjunctivitis: Secondary | ICD-10-CM | POA: Diagnosis not present

## 2020-09-25 DIAGNOSIS — Z23 Encounter for immunization: Secondary | ICD-10-CM

## 2020-09-25 MED ORDER — ERYTHROMYCIN 5 MG/GM OP OINT: 1.0000 "application " | TOPICAL_OINTMENT | Freq: Four times a day (QID) | OPHTHALMIC | 0 refills | Status: AC

## 2020-09-25 NOTE — Progress Notes (Deleted)
   Subjective:    Patient ID: Jared Bryant, male    DOB: July 01, 1946, 74 y.o.   MRN: [id number]  HPI 74 year old male who    Review of Systems     Objective:   Physical Exam        Assessment & Plan:

## 2020-09-25 NOTE — Progress Notes (Signed)
Subjective:    Patient ID: Jared Bryant, male    DOB: [birth year], 74 y.o.   MRN: [id number]  HPI  74 year old male who  has a past medical history of Allergy, BPH (benign prostatic hypertrophy), ED (erectile dysfunction), Hyperlipidemia, and Hypertension.  He presents to the office today for an acute issue for concern of pinkeye in left eye.  Reports that his symptoms started approximately 2 days ago.  Symptoms include redness to his eye, itchiness, purulent discharge, and is eyelid being matted shut when he wakes up.  Denies anybody else having pinkeye in his home.    Denies eye pain, blurred vision, headaches, fevers, or chills  Review of Systems See HPI   Past Medical History:  Diagnosis Date  . Allergy   . BPH (benign prostatic hypertrophy)   . ED (erectile dysfunction)   . Hyperlipidemia   . Hypertension     Social History   Socioeconomic History  . Marital status: Married    Spouse name: Not on file  . Number of children: Not on file  . Years of education: Not on file  . Highest education level: Not on file  Occupational History  . Occupation: retired  Tobacco Use  . Smoking status: Never Smoker  . Smokeless tobacco: Never Used  Substance and Sexual Activity  . Alcohol use: No  . Drug use: No  . Sexual activity: Not on file  Other Topics Concern  . Not on file  Social History Narrative   Married    Two children - both live in [place]    Two Step Children    Social Determinants of Health   Financial Resource Strain:   . Difficulty of Paying Living Expenses: Not on file  Food Insecurity:   . Worried About Programme researcher, broadcasting/film/video in the Last Year: Not on file  . Ran Out of Food in the Last Year: Not on file  Transportation Needs:   . Lack of Transportation (Medical): Not on file  . Lack of Transportation (Non-Medical): Not on file  Physical Activity:   . Days of Exercise per Week: Not on file  . Minutes of Exercise per Session: Not on file  Stress:    . Feeling of Stress : Not on file  Social Connections:   . Frequency of Communication with Friends and Family: Not on file  . Frequency of Social Gatherings with Friends and Family: Not on file  . Attends Religious Services: Not on file  . Active Member of Clubs or Organizations: Not on file  . Attends Banker Meetings: Not on file  . Marital Status: Not on file  Intimate Partner Violence:   . Fear of Current or Ex-Partner: Not on file  . Emotionally Abused: Not on file  . Physically Abused: Not on file  . Sexually Abused: Not on file    Past Surgical History:  Procedure Laterality Date  . COLONOSCOPY N/A 12/21/2013   Procedure: COLONOSCOPY;  Surgeon: Rachael Fee, MD;  Location: [facility];  Service: Endoscopy;  Laterality: N/A;  . TONSILLECTOMY      Family History  Problem Relation Age of Onset  . Heart disease Mother   . Heart disease Father     No Known Allergies  Current Outpatient Medications on File Prior to Visit  Medication Sig Dispense Refill  . amLODipine (NORVASC) 2.5 MG tablet Take 1 tablet by mouth daily. 90 tablet 3  . benzonatate (TESSALON) 200 MG capsule Take  1 capsule (200 mg total) by mouth 2 (two) times daily as needed for cough. 20 capsule 0  . doxazosin (CARDURA) 8 MG tablet Take 1 tablet by mouth at bedtime. 90 tablet 3  . omeprazole (PRILOSEC) 40 MG capsule Take 1 capsule (40 mg total) by mouth daily before breakfast. 90 capsule 3  . simvastatin (ZOCOR) 20 MG tablet Take 1 tablet (20 mg total) by mouth daily. 90 tablet 3  . fluticasone (FLONASE) 50 MCG/ACT nasal spray Place 2 sprays daily into both nostrils. (Patient not taking: Reported on 11/21/2019) 16 g 6   No current facility-administered medications on file prior to visit.    BP 114/66   Pulse 90   Temp 98 F (36.7 C) (Oral)   Resp 18   Wt 227 lb 12.8 oz (103.3 kg)   SpO2 95%   BMI 32.69 kg/m       Objective:   Physical Exam Vitals and nursing note reviewed.    Constitutional:      Appearance: Normal appearance.  Eyes:     General: Lids are normal. Lids are everted, no foreign bodies appreciated.        Left eye: Discharge (purulent ) present.No foreign body.     Conjunctiva/sclera:     Left eye: Left conjunctiva is injected. Exudate present.     Pupils: Pupils are equal, round, and reactive to light.  Neurological:     Mental Status: He is alert.       Assessment & Plan:  1. Bacterial conjunctivitis of left eye - erythromycin ophthalmic ointment; Place 1 application into the left eye 4 (four) times daily for 7 days.  Dispense: 3.5 g; Refill: 0 - Follow up if not proving over the next 2 to 3 days or sooner if symptoms worsen  Shirline Frees, NP

## 2020-11-06 ENCOUNTER — Ambulatory Visit (INDEPENDENT_AMBULATORY_CARE_PROVIDER_SITE_OTHER): Payer: Medicare (Managed Care)

## 2020-11-06 ENCOUNTER — Other Ambulatory Visit: Payer: Self-pay

## 2020-11-06 DIAGNOSIS — Z0000 Encounter for general adult medical examination without abnormal findings: Secondary | ICD-10-CM | POA: Diagnosis not present

## 2020-11-06 NOTE — Progress Notes (Signed)
Subjective:   Jared Bryant is a 75 y.o. male who presents for an Initial Medicare Annual Wellness Visit.   I connected with Virgel Manifold today by telephone and verified that I am speaking with the correct person using two identifiers. Location patient: home Location provider: work Persons participating in the virtual visit: patient, provider.   I discussed the limitations, risks, security and privacy concerns of performing an evaluation and management service by telephone and the availability of in person appointments. I also discussed with the patient that there may be a patient responsible charge related to this service. The patient expressed understanding and verbally consented to this telephonic visit.    Interactive audio and video telecommunications were attempted between this provider and patient, however failed, due to patient having technical difficulties OR patient did not have access to video capability.  We continued and completed visit with audio only.     Review of Systems    N/A  Cardiac Risk Factors include: advanced age (>49men, >54 women);hypertension;male gender     Objective:    Today's Vitals   There is no height or weight on file to calculate BMI.  Advanced Directives 11/06/2020 06/14/2017 05/16/2015 12/21/2013  Does Patient Have a Medical Advance Directive? No No No Patient does not have advance directive  Would patient like information on creating a medical advance directive? No - Patient declined - No - patient declined information -    Current Medications (verified) Outpatient Encounter Medications as of 11/06/2020  Medication Sig  . amLODipine (NORVASC) 2.5 MG tablet Take 1 tablet by mouth daily.  [name] doxazosin (CARDURA) 8 MG tablet Take 1 tablet by mouth at bedtime.  . simvastatin (ZOCOR) 20 MG tablet Take 1 tablet (20 mg total) by mouth daily.  . benzonatate (TESSALON) 200 MG capsule Take 1 capsule (200 mg total) by mouth 2 (two) times daily as needed  for cough. (Patient not taking: Reported on 11/06/2020)  . fluticasone (FLONASE) 50 MCG/ACT nasal spray Place 2 sprays daily into both nostrils. (Patient not taking: No sig reported)  . omeprazole (PRILOSEC) 40 MG capsule Take 1 capsule (40 mg total) by mouth daily before breakfast. (Patient not taking: Reported on 11/06/2020)   No facility-administered encounter medications on file as of 11/06/2020.    Allergies (verified) Patient has no known allergies.   History: Past Medical History:  Diagnosis Date  . Allergy   . BPH (benign prostatic hypertrophy)   . ED (erectile dysfunction)   . Hyperlipidemia   . Hypertension    Past Surgical History:  Procedure Laterality Date  . COLONOSCOPY N/A 12/21/2013   Procedure: COLONOSCOPY;  Surgeon: Rachael Fee, MD;  Location: [facility];  Service: Endoscopy;  Laterality: N/A;  . TONSILLECTOMY     Family History  Problem Relation Age of Onset  . Heart disease Mother   . Heart disease Father    Social History   Socioeconomic History  . Marital status: Married    Spouse name: Not on file  . Number of children: Not on file  . Years of education: Not on file  . Highest education level: Not on file  Occupational History  . Occupation: retired  Tobacco Use  . Smoking status: Never Smoker  . Smokeless tobacco: Never Used  Substance and Sexual Activity  . Alcohol use: No  . Drug use: No  . Sexual activity: Not on file  Other Topics Concern  . Not on file  Social History Narrative   Married  Two children - both live in [place]    Two Step Children    Social Determinants of Health   Financial Resource Strain: Low Risk   . Difficulty of Paying Living Expenses: Not hard at all  Food Insecurity: No Food Insecurity  . Worried About Charity fundraiser in the Last Year: Never true  . Ran Out of Food in the Last Year: Never true  Transportation Needs: No Transportation Needs  . Lack of Transportation (Medical): No  . Lack of  Transportation (Non-Medical): No  Physical Activity: Inactive  . Days of Exercise per Week: 0 days  . Minutes of Exercise per Session: 0 min  Stress: No Stress Concern Present  . Feeling of Stress : Not at all  Social Connections: Socially Integrated  . Frequency of Communication with Friends and Family: More than three times a week  . Frequency of Social Gatherings with Friends and Family: More than three times a week  . Attends Religious Services: More than 4 times per year  . Active Member of Clubs or Organizations: Yes  . Attends Archivist Meetings: More than 4 times per year  . Marital Status: Married    Tobacco Counseling Counseling given: Not Answered   Clinical Intake:           Nutritional Risks: Other (Comment) (Constipation) Diabetes: No  How often do you need to have someone help you when you read instructions, pamphlets, or other written materials from your doctor or pharmacy?: 1 - Never What is the last grade level you completed in school?: College  Diabetic?No   Interpreter Needed?: No  Information entered by :: [place] of Daily Living In your present state of health, do you have any difficulty performing the following activities: 11/06/2020  Hearing? N  Vision? Y  Comment has blurry vision at times  Difficulty concentrating or making decisions? N  Walking or climbing stairs? N  Dressing or bathing? N  Doing errands, shopping? N  Preparing Food and eating ? N  Using the Toilet? N  In the past six months, have you accidently leaked urine? N  Do you have problems with loss of bowel control? N  Managing your Medications? N  Managing your Finances? N  Housekeeping or managing your Housekeeping? N  Some recent data might be hidden    Patient Care Team: Dorothyann Peng, NP as PCP - General (Family Medicine)  Indicate any recent Medical Services you may have received from other than [facility] providers in the past year (date may  be approximate).     Assessment:   This is a routine wellness examination for [place].  Hearing/Vision screen  Hearing Screening   125Hz  250Hz  500Hz  1000Hz  2000Hz  3000Hz  4000Hz  6000Hz  8000Hz   Right ear:           Left ear:           Vision Screening Comments: Patient has been having blurry vision. Plans to get himself and appointment with eye doctor since getting new insurance    Dietary issues and exercise activities discussed: Current Exercise Habits: The patient does not participate in regular exercise at present  Goals    . Exercise 150 min/wk Moderate Activity      Depression Screen PHQ 2/9 Scores 11/06/2020 09/25/2020 11/01/2018 09/15/2017 07/20/2016 01/02/2015 12/12/2013  PHQ - 2 Score 0 0 0 0 0 0 0  PHQ- 9 Score 0 - - - - - -    Fall Risk Fall Risk  11/06/2020 09/25/2020 11/01/2018 09/15/2017 07/20/2016  Falls in the past year? 1 1 0 No No  Number falls in past yr: 0 1 - - -  Injury with Fall? 0 0 - - -  Risk for fall due to : Impaired balance/gait - - - -  Follow up Falls evaluation completed;Falls prevention discussed - - - -    FALL RISK PREVENTION PERTAINING TO THE HOME:  Any stairs in or around the home? Yes  If so, are there any without handrails? No  Home free of loose throw rugs in walkways, pet beds, electrical cords, etc? Yes  Adequate lighting in your home to reduce risk of falls? Yes   ASSISTIVE DEVICES UTILIZED TO PREVENT FALLS:  Life alert? No  Use of a cane, walker or w/c? No  Grab bars in the bathroom? Yes  Shower chair or bench in shower? No  Elevated toilet seat or a handicapped toilet? No    Cognitive Function:   Normal cognitive status assessed by direct observation by this Nurse Health Advisor. No abnormalities found.        Immunizations Immunization History  Administered Date(s) Administered  . Fluad Quad(high Dose 65+) 09/25/2020  . Influenza Whole 07/27/2008  . Influenza, High Dose Seasonal PF 07/20/2016, 09/07/2017, 11/01/2018  .  Influenza, Seasonal, Injecte, Preservative Fre 10/13/2012  . Pneumococcal Conjugate-13 12/12/2013  . Pneumococcal Polysaccharide-23 01/02/2015  . Td 11/02/2006  . Tdap 06/14/2017    TDAP status: Up to date  Flu Vaccine status: Up to date  Pneumococcal vaccine status: Up to date  Covid-19 vaccine status: Completed vaccines  Qualifies for Shingles Vaccine? Yes   Zostavax completed No   Shingrix Completed?: No.    Education has been provided regarding the importance of this vaccine. Patient has been advised to call insurance company to determine out of pocket expense if they have not yet received this vaccine. Advised may also receive vaccine at local pharmacy or Health Dept. Verbalized acceptance and understanding.  Screening Tests Health Maintenance  Topic Date Due  . COVID-19 Vaccine (1) Never done  . COLONOSCOPY (Pts 45-35yrs Insurance coverage will need to be confirmed)  12/22/2023  . TETANUS/TDAP  06/15/2027  . INFLUENZA VACCINE  Completed  . Hepatitis C Screening  Completed  . PNA vac Low Risk Adult  Completed    Health Maintenance  Health Maintenance Due  Topic Date Due  . COVID-19 Vaccine (1) Never done    Colorectal cancer screening: Type of screening: Colonoscopy. Completed 12/21/2013. Repeat every 10 years  Lung Cancer Screening: (Low Dose CT Chest recommended if Age 82-80 years, 30 pack-year currently smoking OR have quit w/in 15years.) does not qualify.   Lung Cancer Screening Referral: N/A   Additional Screening:  Hepatitis C Screening: does qualify; Completed 09/15/2017   Vision Screening: Recommended annual ophthalmology exams for early detection of glaucoma and other disorders of the eye. Is the patient up to date with their annual eye exam?  No  Who is the provider or what is the name of the office in which the patient attends annual eye exams? Does not have an eye doctor  If pt is not established with a provider, would they like to be referred to a  provider to establish care? No .   Dental Screening: Recommended annual dental exams for proper oral hygiene  Community Resource Referral / Chronic Care Management: CRR required this visit?  No   CCM required this visit?  No      Plan:  I have personally reviewed and noted the following in the patient's chart:   . Medical and social history . Use of alcohol, tobacco or illicit drugs  . Current medications and supplements . Functional ability and status . Nutritional status . Physical activity . Advanced directives . List of other physicians . Hospitalizations, surgeries, and ER visits in previous 12 months . Vitals . Screenings to include cognitive, depression, and falls . Referrals and appointments  In addition, I have reviewed and discussed with patient certain preventive protocols, quality metrics, and best practice recommendations. A written personalized care plan for preventive services as well as general preventive health recommendations were provided to patient.     Theodora Blow, LPN   02/05/1900   Nurse Notes: None

## 2020-11-06 NOTE — Patient Instructions (Signed)
Jared Bryant , Thank you for taking time to come for your Medicare Wellness Visit. I appreciate your ongoing commitment to your health goals. Please review the following plan we discussed and let me know if I can assist you in the future.   Screening recommendations/referrals:0 Colonoscopy: Up to date, next due 12/22/2023 Recommended yearly ophthalmology/optometry visit for glaucoma screening and checkup Recommended yearly dental visit for hygiene and checkup  Vaccinations: Influenza vaccine: Up to date, next due fall 2022  Pneumococcal vaccine: Completed series  Tdap vaccine: Up to date, next due 06/15/2027 Shingles vaccine: Currently due for Shingrix, if you wish to receive we recommend that you do so at your local pharmacy     Advanced directives: Advance directive discussed with you today. Even though you declined this today please call our office should you change your mind and we can give you the proper paperwork for you to fill out.   Conditions/risks identified: None   Next appointment: 11/07/2021 @ 11:15 am with Nurse Health Advisor   Preventive Care 65 Years and Older, Male Preventive care refers to lifestyle choices and visits with your health care provider that can promote health and wellness. What does preventive care include?  A yearly physical exam. This is also called an annual well check.  Dental exams once or twice a year.  Routine eye exams. Ask your health care provider how often you should have your eyes checked.  Personal lifestyle choices, including:  Daily care of your teeth and gums.  Regular physical activity.  Eating a healthy diet.  Avoiding tobacco and drug use.  Limiting alcohol use.  Practicing safe sex.  Taking low doses of aspirin every day.  Taking vitamin and mineral supplements as recommended by your health care provider. What happens during an annual well check? The services and screenings done by your health care provider during  your annual well check will depend on your age, overall health, lifestyle risk factors, and family history of disease. Counseling  Your health care provider may ask you questions about your:  Alcohol use.  Tobacco use.  Drug use.  Emotional well-being.  Home and relationship well-being.  Sexual activity.  Eating habits.  History of falls.  Memory and ability to understand (cognition).  Work and work Astronomer. Screening  You may have the following tests or measurements:  Height, weight, and BMI.  Blood pressure.  Lipid and cholesterol levels. These may be checked every 5 years, or more frequently if you are over 56 years old.  Skin check.  Lung cancer screening. You may have this screening every year starting at age 79 if you have a 30-pack-year history of smoking and currently smoke or have quit within the past 15 years.  Fecal occult blood test (FOBT) of the stool. You may have this test every year starting at age 34.  Flexible sigmoidoscopy or colonoscopy. You may have a sigmoidoscopy every 5 years or a colonoscopy every 10 years starting at age 104.  Prostate cancer screening. Recommendations will vary depending on your family history and other risks.  Hepatitis C blood test.  Hepatitis B blood test.  Sexually transmitted disease (STD) testing.  Diabetes screening. This is done by checking your blood sugar (glucose) after you have not eaten for a while (fasting). You may have this done every 1-3 years.  Abdominal aortic aneurysm (AAA) screening. You may need this if you are a current or former smoker.  Osteoporosis. You may be screened starting at age 1 if  you are at high risk. Talk with your health care provider about your test results, treatment options, and if necessary, the need for more tests. Vaccines  Your health care provider may recommend certain vaccines, such as:  Influenza vaccine. This is recommended every year.  Tetanus, diphtheria, and  acellular pertussis (Tdap, Td) vaccine. You may need a Td booster every 10 years.  Zoster vaccine. You may need this after age 10.  Pneumococcal 13-valent conjugate (PCV13) vaccine. One dose is recommended after age 65.  Pneumococcal polysaccharide (PPSV23) vaccine. One dose is recommended after age 20. Talk to your health care provider about which screenings and vaccines you need and how often you need them. This information is not intended to replace advice given to you by your health care provider. Make sure you discuss any questions you have with your health care provider. Document Released: 11/15/2015 Document Revised: 07/08/2016 Document Reviewed: 08/20/2015 Elsevier Interactive Patient Education  2017 [employer] Prevention in the Home Falls can cause injuries. They can happen to people of all ages. There are many things you can do to make your home safe and to help prevent falls. What can I do on the outside of my home?  Regularly fix the edges of walkways and driveways and fix any cracks.  Remove anything that might make you trip as you walk through a door, such as a raised step or threshold.  Trim any bushes or trees on the path to your home.  Use bright outdoor lighting.  Clear any walking paths of anything that might make someone trip, such as rocks or tools.  Regularly check to see if handrails are loose or broken. Make sure that both sides of any steps have handrails.  Any raised decks and porches should have guardrails on the edges.  Have any leaves, snow, or ice cleared regularly.  Use sand or salt on walking paths during winter.  Clean up any spills in your garage right away. This includes oil or grease spills. What can I do in the bathroom?  Use night lights.  Install grab bars by the toilet and in the tub and shower. Do not use towel bars as grab bars.  Use non-skid mats or decals in the tub or shower.  If you need to sit down in the shower, use  a plastic, non-slip stool.  Keep the floor dry. Clean up any water that spills on the floor as soon as it happens.  Remove soap buildup in the tub or shower regularly.  Attach bath mats securely with double-sided non-slip rug tape.  Do not have throw rugs and other things on the floor that can make you trip. What can I do in the bedroom?  Use night lights.  Make sure that you have a light by your bed that is easy to reach.  Do not use any sheets or blankets that are too big for your bed. They should not hang down onto the floor.  Have a firm chair that has side arms. You can use this for support while you get dressed.  Do not have throw rugs and other things on the floor that can make you trip. What can I do in the kitchen?  Clean up any spills right away.  Avoid walking on wet floors.  Keep items that you use a lot in easy-to-reach places.  If you need to reach something above you, use a strong step stool that has a grab bar.  Keep electrical cords  out of the way.  Do not use floor polish or wax that makes floors slippery. If you must use wax, use non-skid floor wax.  Do not have throw rugs and other things on the floor that can make you trip. What can I do with my stairs?  Do not leave any items on the stairs.  Make sure that there are handrails on both sides of the stairs and use them. Fix handrails that are broken or loose. Make sure that handrails are as long as the stairways.  Check any carpeting to make sure that it is firmly attached to the stairs. Fix any carpet that is loose or worn.  Avoid having throw rugs at the top or bottom of the stairs. If you do have throw rugs, attach them to the floor with carpet tape.  Make sure that you have a light switch at the top of the stairs and the bottom of the stairs. If you do not have them, ask someone to add them for you. What else can I do to help prevent falls?  Wear shoes that:  Do not have high heels.  Have  rubber bottoms.  Are comfortable and fit you well.  Are closed at the toe. Do not wear sandals.  If you use a stepladder:  Make sure that it is fully opened. Do not climb a closed stepladder.  Make sure that both sides of the stepladder are locked into place.  Ask someone to hold it for you, if possible.  Clearly mark and make sure that you can see:  Any grab bars or handrails.  First and last steps.  Where the edge of each step is.  Use tools that help you move around (mobility aids) if they are needed. These include:  Canes.  Walkers.  Scooters.  Crutches.  Turn on the lights when you go into a dark area. Replace any light bulbs as soon as they burn out.  Set up your furniture so you have a clear path. Avoid moving your furniture around.  If any of your floors are uneven, fix them.  If there are any pets around you, be aware of where they are.  Review your medicines with your doctor. Some medicines can make you feel dizzy. This can increase your chance of falling. Ask your doctor what other things that you can do to help prevent falls. This information is not intended to replace advice given to you by your health care provider. Make sure you discuss any questions you have with your health care provider. Document Released: 08/15/2009 Document Revised: 03/26/2016 Document Reviewed: 11/23/2014 Elsevier Interactive Patient Education  2017 [employer].

## 2020-12-04 ENCOUNTER — Other Ambulatory Visit: Payer: Self-pay | Admitting: Adult Health

## 2020-12-04 DIAGNOSIS — I10 Essential (primary) hypertension: Secondary | ICD-10-CM

## 2020-12-05 NOTE — Telephone Encounter (Signed)
30 DAY SUPPLY SENT TO THE PHARMACY.  PT IS DUE FOR CPX. 

## 2020-12-24 ENCOUNTER — Other Ambulatory Visit: Payer: Self-pay | Admitting: Adult Health

## 2020-12-24 DIAGNOSIS — R351 Nocturia: Secondary | ICD-10-CM

## 2020-12-24 DIAGNOSIS — N401 Enlarged prostate with lower urinary tract symptoms: Secondary | ICD-10-CM

## 2020-12-26 NOTE — Telephone Encounter (Signed)
SENT TO THE PHARMACY BY E-SCRIBE FOR 30 DAYS.  PT IS PAST DUE FOR CPX. 

## 2021-01-07 ENCOUNTER — Other Ambulatory Visit: Payer: Self-pay | Admitting: Adult Health

## 2021-01-07 DIAGNOSIS — I10 Essential (primary) hypertension: Secondary | ICD-10-CM

## 2021-01-09 ENCOUNTER — Other Ambulatory Visit: Payer: Self-pay

## 2021-01-09 DIAGNOSIS — I10 Essential (primary) hypertension: Secondary | ICD-10-CM

## 2021-01-09 MED ORDER — AMLODIPINE BESYLATE 2.5 MG PO TABS: ORAL_TABLET | ORAL | 0 refills | Status: AC

## 2021-01-09 NOTE — Telephone Encounter (Signed)
Patient is calling back to check the status of the medication refill. CB is [phone]

## 2021-01-09 NOTE — Telephone Encounter (Signed)
Patient advised to call today and schedule a yearly assessment. He was given another 30 days of the medication.

## 2021-01-15 ENCOUNTER — Telehealth: Payer: Self-pay | Admitting: Adult Health

## 2021-01-15 NOTE — Telephone Encounter (Signed)
FYI:  Pt is calling to let us know that he has made is CPE for 2022 with [facility] and if he should need any medication before then (blood pressure) please send it in.

## 2021-01-24 ENCOUNTER — Telehealth: Payer: Self-pay | Admitting: Adult Health

## 2021-01-24 DIAGNOSIS — R351 Nocturia: Secondary | ICD-10-CM

## 2021-01-24 DIAGNOSIS — N401 Enlarged prostate with lower urinary tract symptoms: Secondary | ICD-10-CM

## 2021-01-24 MED ORDER — DOXAZOSIN MESYLATE 8 MG PO TABS: ORAL_TABLET | ORAL | 0 refills | Status: AC

## 2021-01-24 NOTE — Telephone Encounter (Signed)
Rx has been sent in. Spoke with the patient and he has been made aware. Nothing further needed.

## 2021-01-24 NOTE — Telephone Encounter (Signed)
Pt call and need a refill on  doxazosin (CARDURA) 8 MG tablet sent to  [id number] - [address], [place] - [address] Phone:  [phone]  Fax:  [phone]

## 2021-02-19 ENCOUNTER — Other Ambulatory Visit: Payer: Self-pay | Admitting: Adult Health

## 2021-02-19 NOTE — Telephone Encounter (Signed)
See phone notes/refill request from 06/04/20 and 08/13/20, not sure if pt is suppose to still be on med. It looks like phone notes state pt is suppose to be on med, but med isn't on med list, please review and advise

## 2021-02-20 ENCOUNTER — Encounter: Payer: Medicare (Managed Care) | Admitting: Adult Health

## 2021-11-07 ENCOUNTER — Ambulatory Visit: Payer: Medicare (Managed Care)

## 2023-03-08 ENCOUNTER — Ambulatory Visit (INDEPENDENT_AMBULATORY_CARE_PROVIDER_SITE_OTHER): Payer: Medicare (Managed Care)

## 2023-03-08 ENCOUNTER — Ambulatory Visit (HOSPITAL_COMMUNITY)
Admission: EM | Admit: 2023-03-08 | Discharge: 2023-03-08 | Disposition: A | Discharge status: Home / Self Care | Payer: Medicare (Managed Care) | Attending: Physician Assistant | Admitting: Physician Assistant

## 2023-03-08 ENCOUNTER — Encounter (HOSPITAL_COMMUNITY): Payer: Self-pay | Admitting: Emergency Medicine

## 2023-03-08 DIAGNOSIS — S8391XA Sprain of unspecified site of right knee, initial encounter: Secondary | ICD-10-CM | POA: Diagnosis not present

## 2023-03-08 NOTE — ED Triage Notes (Signed)
Pt reports right knee pain since yesterday. States he was at church and went to stand up and felt like knee gave out.

## 2023-03-08 NOTE — ED Provider Notes (Signed)
[facility]    CSN: [id number] Arrival date & time: 03/08/23  1951      History   Chief Complaint Chief Complaint  Patient presents with   Knee Pain    HPI DAYRON DIETERT is a 77 y.o. male.   Patient complains of pain in his right knee after slipping yesterday.  Patient reports that his shoe was slick his foot slid and he had a pop behind his right knee.  Patient complains of pain with walking.  The history is provided by the patient and the spouse. No language interpreter was used.  Knee Pain Location:  Knee Time since incident:  1 day Injury: yes   Knee location:  R knee Pain details:    Quality:  Aching   Severity:  Moderate   Timing:  Constant   Progression:  Worsening Chronicity:  New Relieved by:  Nothing Worsened by:  Nothing   Past Medical History:  Diagnosis Date   Allergy    BPH (benign prostatic hypertrophy)    ED (erectile dysfunction)    Hyperlipidemia    Hypertension     Patient Active Problem List   Diagnosis Date Noted   History of dysphasia 07/20/2016   Nonspecific abnormal finding in stool contents 12/21/2013   Diverticulosis of colon (without mention of hemorrhage) 12/21/2013   Abdominal bloating 12/12/2013   Hyperlipidemia 07/27/2008   ERECTILE DYSFUNCTION 07/27/2008   Essential hypertension 07/27/2008   BPH associated with nocturia 07/27/2008    Past Surgical History:  Procedure Laterality Date   COLONOSCOPY N/A 12/21/2013   Procedure: COLONOSCOPY;  Surgeon: Rachael Fee, MD;  Location: [facility];  Service: Endoscopy;  Laterality: N/A;   TONSILLECTOMY         Home Medications    Prior to Admission medications   Medication Sig Start Date End Date Taking? Authorizing Provider  amLODipine (NORVASC) 2.5 MG tablet TAKE ONE TABLET BY MOUTH ONE TIME DAILY ** DUE FOR PHYSICAL** 01/09/21   Nafziger, Kandee Keen, NP  amLODipine (NORVASC) 2.5 MG tablet TAKE ONE TABLET BY MOUTH ONE TIME DAILY.  **DUE FOR PHYSICAL** 01/09/21    Nafziger, Kandee Keen, NP  benzonatate (TESSALON) 200 MG capsule Take 1 capsule (200 mg total) by mouth 2 (two) times daily as needed for cough. Patient not taking: Reported on 11/06/2020 11/01/18   Shirline Frees, NP  doxazosin (CARDURA) 8 MG tablet Take 1 tablet by mouth at bedtime.  **DUE FOR YEARLY PHYSICAL** 01/24/21   Nafziger, Kandee Keen, NP  fluticasone (FLONASE) 50 MCG/ACT nasal spray Place 2 sprays daily into both nostrils. Patient not taking: No sig reported 09/07/17   Shirline Frees, NP  lisinopril (ZESTRIL) 30 MG tablet Take 1 tablet (30 mg total) by mouth daily. Need physical exam for further refills 02/19/21   Shirline Frees, NP  omeprazole (PRILOSEC) 40 MG capsule Take 1 capsule (40 mg total) by mouth daily before breakfast. Patient not taking: Reported on 11/06/2020 11/21/19   Shirline Frees, NP  simvastatin (ZOCOR) 20 MG tablet Take 1 tablet (20 mg total) by mouth daily. 11/21/19   Nafziger, Kandee Keen, NP    Family History Family History  Problem Relation Age of Onset   Heart disease Mother    Heart disease Father     Social History Social History   Tobacco Use   Smoking status: Never   Smokeless tobacco: Never  Substance Use Topics   Alcohol use: No   Drug use: No     Allergies   Patient has no  known allergies.   Review of Systems Review of Systems  All other systems reviewed and are negative.    Physical Exam Triage Vital Signs ED Triage Vitals  Enc Vitals Group     BP 03/08/23 2008 (!) 155/90     Pulse Rate 03/08/23 2008 61     Resp 03/08/23 2008 18     Temp 03/08/23 2008 99.3 F (37.4 C)     Temp Source 03/08/23 2008 Oral     SpO2 03/08/23 2008 94 %     Weight --      Height --      Head Circumference --      Peak Flow --      Pain Score 03/08/23 2009 6     Pain Loc --      Pain Edu? --      Excl. in GC? --    No data found.  Updated Vital Signs BP (!) 155/90 (BP Location: Left Arm)   Pulse 61   Temp 99.3 F (37.4 C) (Oral)   Resp 18   SpO2 94%    Visual Acuity Right Eye Distance:   Left Eye Distance:   Bilateral Distance:    Right Eye Near:   Left Eye Near:    Bilateral Near:     Physical Exam Vitals and nursing note reviewed.  Constitutional:      Appearance: He is well-developed.  HENT:     Head: Normocephalic.  Cardiovascular:     Rate and Rhythm: Normal rate.  Abdominal:     General: There is no distension.  Musculoskeletal:        General: Swelling and tenderness present. Normal range of motion.     Comments: Tender posterior knee decreased range of motion neurovascular neurosensory are intact  Skin:    General: Skin is warm.  Neurological:     General: No focal deficit present.     Mental Status: He is alert and oriented to person, place, and time.      UC Treatments / Results  Labs (all labs ordered are listed, but only abnormal results are displayed) Labs Reviewed - No data to display  EKG   Radiology DG Knee Complete 4 Views Right  Result Date: 03/08/2023 CLINICAL DATA:  Pain EXAM: RIGHT KNEE - COMPLETE 4 VIEW COMPARISON:  None Available. FINDINGS: No acute fracture or dislocation. Overall preserved joint spaces. Small osteophytes seen of all 3 compartments. No joint effusion on lateral view. IMPRESSION: Mild degenerative changes. Electronically Signed   By: Karen Kays M.D.   On: 03/08/2023 20:20    Procedures Procedures (including critical care time)  Medications Ordered in UC Medications - No data to display  Initial Impression / Assessment and Plan / UC Course  I have reviewed the triage vital signs and the nursing notes.  Pertinent labs & imaging results that were available during my care of the patient were reviewed by me and considered in my medical decision making (see chart for details).  Clinical Course as of 03/08/23 2023  [facility] Mar 08, 2023  2022 DG Knee Complete 4 Views Right [LS]    Clinical Course User Index [LS] Elson Areas, [place]    MDM x-ray shows degenerative  changes no acute injury.  Patient advised over-the-counter medications Tylenol or ibuprofen patient is given referral to orthopedist for follow-up. Final Clinical Impressions(s) / UC Diagnoses   Final diagnoses:  Sprain of right knee, unspecified ligament, initial encounter   Discharge Instructions  None    ED Prescriptions   None    PDMP not reviewed this encounter. An After Visit Summary was printed and given to the patient.    Elson Areas, [place] 03/08/23 2028
